# Patient Record
Sex: Female | Born: 1935 | Race: White | Hispanic: No | Smoking: Never smoker
Health system: Southern US, Community
[De-identification: ages and names within clinical notes are randomized; demographics above are authoritative.]

## PROBLEM LIST (undated history)

## (undated) DIAGNOSIS — T7840XA Allergy, unspecified, initial encounter: Secondary | ICD-10-CM

## (undated) DIAGNOSIS — N390 Urinary tract infection, site not specified: Secondary | ICD-10-CM

## (undated) DIAGNOSIS — I10 Essential (primary) hypertension: Secondary | ICD-10-CM

## (undated) DIAGNOSIS — K589 Irritable bowel syndrome without diarrhea: Secondary | ICD-10-CM

## (undated) HISTORY — PX: TONSILLECTOMY: SUR1361

## (undated) HISTORY — PX: APPENDECTOMY: SHX54

## (undated) HISTORY — PX: ABDOMINAL HYSTERECTOMY: SHX81

## (undated) HISTORY — PX: HIP ARTHROPLASTY: SHX981

## (undated) HISTORY — PX: BACK SURGERY: SHX140

---

## 2011-02-28 DIAGNOSIS — R350 Frequency of micturition: Secondary | ICD-10-CM | POA: Insufficient documentation

## 2011-02-28 DIAGNOSIS — M26629 Arthralgia of temporomandibular joint, unspecified side: Secondary | ICD-10-CM | POA: Insufficient documentation

## 2011-05-19 DIAGNOSIS — R5383 Other fatigue: Secondary | ICD-10-CM | POA: Insufficient documentation

## 2011-07-04 DIAGNOSIS — N390 Urinary tract infection, site not specified: Secondary | ICD-10-CM | POA: Insufficient documentation

## 2011-07-04 DIAGNOSIS — N398 Other specified disorders of urinary system: Secondary | ICD-10-CM | POA: Insufficient documentation

## 2011-07-26 DIAGNOSIS — H1013 Acute atopic conjunctivitis, bilateral: Secondary | ICD-10-CM | POA: Insufficient documentation

## 2011-08-03 DIAGNOSIS — R159 Full incontinence of feces: Secondary | ICD-10-CM | POA: Insufficient documentation

## 2011-11-15 DIAGNOSIS — M7501 Adhesive capsulitis of right shoulder: Secondary | ICD-10-CM | POA: Insufficient documentation

## 2011-11-15 DIAGNOSIS — M7550 Bursitis of unspecified shoulder: Secondary | ICD-10-CM | POA: Insufficient documentation

## 2012-03-13 DIAGNOSIS — H8110 Benign paroxysmal vertigo, unspecified ear: Secondary | ICD-10-CM | POA: Insufficient documentation

## 2012-03-13 DIAGNOSIS — H9313 Tinnitus, bilateral: Secondary | ICD-10-CM | POA: Insufficient documentation

## 2012-03-13 DIAGNOSIS — H903 Sensorineural hearing loss, bilateral: Secondary | ICD-10-CM | POA: Insufficient documentation

## 2012-03-29 DIAGNOSIS — K219 Gastro-esophageal reflux disease without esophagitis: Secondary | ICD-10-CM | POA: Insufficient documentation

## 2013-01-04 DIAGNOSIS — Z961 Presence of intraocular lens: Secondary | ICD-10-CM | POA: Insufficient documentation

## 2013-11-13 DIAGNOSIS — R339 Retention of urine, unspecified: Secondary | ICD-10-CM | POA: Insufficient documentation

## 2013-11-13 DIAGNOSIS — IMO0002 Reserved for concepts with insufficient information to code with codable children: Secondary | ICD-10-CM | POA: Insufficient documentation

## 2013-11-13 DIAGNOSIS — D179 Benign lipomatous neoplasm, unspecified: Secondary | ICD-10-CM | POA: Insufficient documentation

## 2014-06-03 DIAGNOSIS — F331 Major depressive disorder, recurrent, moderate: Secondary | ICD-10-CM | POA: Insufficient documentation

## 2014-12-16 DIAGNOSIS — E042 Nontoxic multinodular goiter: Secondary | ICD-10-CM | POA: Insufficient documentation

## 2014-12-27 DIAGNOSIS — Z889 Allergy status to unspecified drugs, medicaments and biological substances status: Secondary | ICD-10-CM | POA: Insufficient documentation

## 2016-06-21 DIAGNOSIS — E559 Vitamin D deficiency, unspecified: Secondary | ICD-10-CM | POA: Insufficient documentation

## 2016-06-21 DIAGNOSIS — E538 Deficiency of other specified B group vitamins: Secondary | ICD-10-CM | POA: Insufficient documentation

## 2017-05-30 DIAGNOSIS — S52125D Nondisplaced fracture of head of left radius, subsequent encounter for closed fracture with routine healing: Secondary | ICD-10-CM | POA: Insufficient documentation

## 2017-06-19 DIAGNOSIS — S96912A Strain of unspecified muscle and tendon at ankle and foot level, left foot, initial encounter: Secondary | ICD-10-CM | POA: Insufficient documentation

## 2017-10-14 DIAGNOSIS — M76892 Other specified enthesopathies of left lower limb, excluding foot: Secondary | ICD-10-CM | POA: Insufficient documentation

## 2018-06-18 DIAGNOSIS — A0471 Enterocolitis due to Clostridium difficile, recurrent: Secondary | ICD-10-CM | POA: Insufficient documentation

## 2018-09-04 DIAGNOSIS — N10 Acute tubulo-interstitial nephritis: Secondary | ICD-10-CM | POA: Insufficient documentation

## 2018-10-12 ENCOUNTER — Other Ambulatory Visit
Admission: RE | Admit: 2018-10-12 | Discharge: 2018-10-12 | Disposition: A | Discharge status: Home / Self Care | Payer: Medicare Other | Source: Ambulatory Visit | Attending: Student | Admitting: Student

## 2018-10-12 DIAGNOSIS — K529 Noninfective gastroenteritis and colitis, unspecified: Secondary | ICD-10-CM | POA: Diagnosis present

## 2018-10-12 LAB — GASTROINTESTINAL PANEL BY PCR, STOOL (REPLACES STOOL CULTURE)
Adenovirus F40/41: NOT DETECTED
Astrovirus: NOT DETECTED
Campylobacter species: NOT DETECTED
Cryptosporidium: NOT DETECTED
Cyclospora cayetanensis: NOT DETECTED
Entamoeba histolytica: NOT DETECTED
Enteroaggregative E coli (EAEC): NOT DETECTED
Enteropathogenic E coli (EPEC): NOT DETECTED
Enterotoxigenic E coli (ETEC): NOT DETECTED
Giardia lamblia: NOT DETECTED
Norovirus GI/GII: NOT DETECTED
PLESIMONAS SHIGELLOIDES: NOT DETECTED
Rotavirus A: NOT DETECTED
SALMONELLA SPECIES: NOT DETECTED
SHIGA LIKE TOXIN PRODUCING E COLI (STEC): NOT DETECTED
SHIGELLA/ENTEROINVASIVE E COLI (EIEC): NOT DETECTED
Sapovirus (I, II, IV, and V): NOT DETECTED
Vibrio cholerae: NOT DETECTED
Vibrio species: NOT DETECTED
Yersinia enterocolitica: NOT DETECTED

## 2018-10-12 LAB — C DIFFICILE QUICK SCREEN W PCR REFLEX
C Diff antigen: NEGATIVE
C Diff interpretation: NOT DETECTED
C Diff toxin: NEGATIVE

## 2018-10-16 LAB — CALPROTECTIN, FECAL: Calprotectin, Fecal: 32 ug/g (ref 0–120)

## 2018-10-16 LAB — PANCREATIC ELASTASE, FECAL: Pancreatic Elastase-1, Stool: 414 ug Elast./g (ref >200)

## 2018-10-24 ENCOUNTER — Encounter: Payer: Self-pay | Admitting: Urology

## 2018-10-24 ENCOUNTER — Ambulatory Visit (INDEPENDENT_AMBULATORY_CARE_PROVIDER_SITE_OTHER): Payer: Medicare Other | Admitting: Urology

## 2018-10-24 VITALS — BP 117/58 | HR 78 | Ht 62.0 in | Wt 151.0 lb

## 2018-10-24 DIAGNOSIS — N390 Urinary tract infection, site not specified: Secondary | ICD-10-CM | POA: Diagnosis not present

## 2018-10-24 DIAGNOSIS — N3941 Urge incontinence: Secondary | ICD-10-CM | POA: Diagnosis not present

## 2018-10-24 LAB — URINALYSIS, COMPLETE
Bilirubin, UA: NEGATIVE
Glucose, UA: NEGATIVE
Ketones, UA: NEGATIVE
Nitrite, UA: NEGATIVE
Specific Gravity, UA: 1.015 (ref 1.005–1.030)
Urobilinogen, Ur: 0.2 mg/dL (ref 0.2–1.0)
pH, UA: 5.5 (ref 5.0–7.5)

## 2018-10-24 LAB — MICROSCOPIC EXAMINATION: RBC MICROSCOPIC, UA: NONE SEEN /HPF (ref 0–2)

## 2018-10-24 NOTE — Progress Notes (Signed)
10/24/2018 9:15 AM   Molly Figueroa [birth year] [id number]  Referring provider: Donnamarie Rossetti, PA-C [place], [place] [address]  Chief Complaint  Patient presents with  . Recurrent UTI    HPI:  85 yo female referred for recurrent UTI. She is on vaginal estrogen since Nov 2019. She's been having "UTI" for years. She gets dysuria and abx help. She typically voids with a good stream and no straining, but doesn't empty. She has seen the downside to abx with recent diarrhea and C. Diff. She saw [facility] for years. In the past her post void residuals have been around 200 mL. In order to help empty, they tried clean intermittent catheterization (patient thought this increased her infections due to difficulty catheterizing her self cleanly), flomax (did not tolerate side effects), tibial nerve stimulation (no improvement in emptying). Prior cystoscopies (2012, 2014, 2016) and CT workups (2019) have been unrevealing. CT A/P 04/2018 at [facility] for possible GI bleed report mentions no hydronephrosis or stones. 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 No gross hematuria. She drinks mainly water. She has IBS, diarrhea. She also has some urgency and UUI. She is interested in PT/OT at [facility].   NG risk includes CVA. Prior hysterectomy. Mobility issues/waler use.   Modifying factors: There are no other modifying factors  Associated signs and symptoms: There are no other associated signs and symptoms Aggravating and relieving factors: There are no other aggravating or relieving factors Severity: Moderate Duration: Persistent   PMH: History reviewed. No pertinent past medical history.  Surgical History: History reviewed. No pertinent surgical history.  Home Medications:  Allergies as of 10/24/2018      Reactions   Amlodipine Swelling   Codeine Nausea And Vomiting   Gabapentin Itching   Gentak [gentamicin Sulfate] Itching   Phenothiazines Nausea  And Vomiting   Tobradex [tobramycin-dexamethasone] Itching   Wellbutrin [bupropion] Swelling   Throat pain   Cefuroxime Axetil Rash   Macrolides And Ketolides Rash   Sulfa Antibiotics Rash      Medication List       Accurate as of October 24, 2018  9:15 AM. Always use your most recent med list.        aspirin EC 81 MG tablet Take by mouth.   belladonna-opium 16.2-30 MG suppository Commonly known as:  B&O SUPPRETTES UNW AND I 1 SUP REC Q 8 H PRN   dicyclomine 20 MG tablet Commonly known as:  BENTYL Take by mouth.   escitalopram 20 MG tablet Commonly known as:  LEXAPRO Take by mouth.   estradiol 0.1 MG/GM vaginal cream Commonly known as:  ESTRACE Place vaginally.   lidocaine 5 % Commonly known as:  [place] onto the skin.   meloxicam 7.5 MG tablet Commonly known as:  MOBIC Take by mouth.   Olopatadine HCl 0.2 % Soln Apply to eye.   omeprazole 20 MG capsule Commonly known as:  PRILOSEC Take by mouth.   telmisartan 20 MG tablet Commonly known as:  MICARDIS Take by mouth.   triamterene-hydrochlorothiazide 37.5-25 MG tablet Commonly known as:  MAXZIDE-25 Take by mouth.   vancomycin 125 MG capsule Commonly known as:  VANCOCIN Take by mouth.       Allergies:  Allergies  Allergen Reactions  . Amlodipine Swelling  . Codeine Nausea And Vomiting  . Gabapentin Itching  . Gentak [Gentamicin Sulfate] Itching  . Phenothiazines Nausea And Vomiting  . Tobradex [Tobramycin-Dexamethasone] Itching  .  Wellbutrin [Bupropion] Swelling    Throat pain   . Cefuroxime Axetil Rash  . Macrolides And Ketolides Rash  . Sulfa Antibiotics Rash    Family History: Family History  Problem Relation Age of Onset  . Bladder Cancer Neg Hx   . Kidney cancer Neg Hx     Social History:  reports that she has never smoked. She has never used smokeless tobacco. She reports previous alcohol use. She reports that she does not use drugs.  ROS: UROLOGY Frequent  Urination?: Yes Hard to postpone urination?: No Burning/pain with urination?: No Get up at night to urinate?: Yes Leakage of urine?: Yes Urine stream starts and stops?: No Trouble starting stream?: No Do you have to strain to urinate?: No Blood in urine?: No Urinary tract infection?: Yes Sexually transmitted disease?: No Injury to kidneys or bladder?: No Painful intercourse?: No Weak stream?: No Currently pregnant?: No Vaginal bleeding?: No Last menstrual period?: n  Gastrointestinal Nausea?: No Vomiting?: No Indigestion/heartburn?: Yes Diarrhea?: Yes Constipation?: No  Constitutional Fever: No Night sweats?: No Weight loss?: No Fatigue?: Yes  Skin Skin rash/lesions?: Yes Itching?: Yes  Eyes Blurred vision?: Yes Double vision?: No  Ears/Nose/Throat Sore throat?: No Sinus problems?: No  Hematologic/Lymphatic Swollen glands?: No Easy bruising?: No  Cardiovascular Leg swelling?: No Chest pain?: No  Respiratory Cough?: No Shortness of breath?: No  Endocrine Excessive thirst?: No  Musculoskeletal Back pain?: No Joint pain?: No  Neurological Headaches?: No Dizziness?: No  Psychologic Depression?: No Anxiety?: No  Physical Exam: BP (!) 117/58 (BP Location: Left Arm, Patient Position: Sitting, Cuff Size: Normal)   Pulse 78   Ht 5\' 2"  (1.575 m)   Wt 68.5 kg   BMI 27.62 kg/m   Constitutional:  Alert and oriented, No acute distress. HEENT: [place] AT, moist mucus membranes.  Trachea midline, no masses. Cardiovascular: No clubbing, cyanosis, or edema. Respiratory: Normal respiratory effort, no increased work of breathing. GI: Abdomen is soft, nontender, nondistended, no abdominal masses GU: No CVA tenderness Lymph: No cervical or inguinal lymphadenopathy. Skin: No rashes, bruises or suspicious lesions. Neurologic: Grossly intact, no focal deficits, moving all 4 extremities. Psychiatric: Normal mood and affect.  Laboratory Data: No results found  for: WBC, HGB, HCT, MCV, PLT  No results found for: CREATININE  No results found for: PSA  No results found for: TESTOSTERONE  No results found for: HGBA1C  Urinalysis No results found for: COLORURINE, APPEARANCEUR, LABSPEC, PHURINE, GLUCOSEU, HGBUR, BILIRUBINUR, KETONESUR, PROTEINUR, UROBILINOGEN, NITRITE, LEUKOCYTESUR  No results found for: LABMICR, WBCUA, RBCUA, LABEPIT, MUCUS, BACTERIA   No results found for this or any previous visit. No results found for this or any previous visit. No results found for this or any previous visit. No results found for this or any previous visit. No results found for this or any previous visit. No results found for this or any previous visit. No results found for this or any previous visit. No results found for this or any previous visit.  Assessment & Plan:    1. Recurrent UTI She is doing well on TV estrogen and we will definitely avoid abx unless significant bladder pain, dysuria or fever. Discussed bacteria in bladder can actually protect against UTI.   2. Urgency, UUI - pt has PT/OT available at [facility] and she wants to start.   - Urinalysis, Complete   No follow-ups on file.  Festus Aloe, MD  [facility] [address] [place], [place] [address] [phone]

## 2018-10-24 NOTE — Patient Instructions (Signed)

## 2018-10-29 ENCOUNTER — Telehealth: Payer: Self-pay | Admitting: Urology

## 2018-10-29 NOTE — Telephone Encounter (Signed)
Left message for the PT dept at [facility] to call back to discuss if the patient can have PT there? [place]

## 2018-10-30 NOTE — Telephone Encounter (Signed)
Mckinley from [facility] called back and stated that she was taking care of getting the PT arranged for the patient and did not need an order.   Sharyn Lull

## 2018-11-12 ENCOUNTER — Other Ambulatory Visit: Payer: Self-pay | Admitting: Sports Medicine

## 2018-11-12 DIAGNOSIS — M1611 Unilateral primary osteoarthritis, right hip: Secondary | ICD-10-CM

## 2018-11-12 DIAGNOSIS — M25451 Effusion, right hip: Secondary | ICD-10-CM

## 2018-11-12 DIAGNOSIS — W19XXXD Unspecified fall, subsequent encounter: Secondary | ICD-10-CM

## 2018-11-12 DIAGNOSIS — M25551 Pain in right hip: Secondary | ICD-10-CM

## 2018-11-13 ENCOUNTER — Ambulatory Visit
Admission: RE | Admit: 2018-11-13 | Discharge: 2018-11-13 | Disposition: A | Discharge status: Home / Self Care | Payer: Medicare Other | Source: Ambulatory Visit | Attending: Sports Medicine | Admitting: Sports Medicine

## 2018-11-13 ENCOUNTER — Ambulatory Visit: Payer: Medicare Other

## 2018-11-13 ENCOUNTER — Other Ambulatory Visit: Payer: Self-pay

## 2018-11-13 DIAGNOSIS — G8929 Other chronic pain: Secondary | ICD-10-CM | POA: Diagnosis not present

## 2018-11-13 DIAGNOSIS — M1611 Unilateral primary osteoarthritis, right hip: Secondary | ICD-10-CM | POA: Diagnosis present

## 2018-11-13 DIAGNOSIS — W19XXXD Unspecified fall, subsequent encounter: Secondary | ICD-10-CM | POA: Insufficient documentation

## 2018-11-13 DIAGNOSIS — M25551 Pain in right hip: Secondary | ICD-10-CM

## 2018-11-13 DIAGNOSIS — R55 Syncope and collapse: Secondary | ICD-10-CM | POA: Diagnosis not present

## 2018-11-13 DIAGNOSIS — M25451 Effusion, right hip: Secondary | ICD-10-CM | POA: Diagnosis present

## 2019-01-02 DIAGNOSIS — M8700 Idiopathic aseptic necrosis of unspecified bone: Secondary | ICD-10-CM | POA: Insufficient documentation

## 2019-01-22 HISTORY — PX: JOINT REPLACEMENT: SHX530

## 2019-01-27 ENCOUNTER — Other Ambulatory Visit: Payer: Self-pay

## 2019-01-27 ENCOUNTER — Emergency Department
Admission: EM | Admit: 2019-01-27 | Discharge: 2019-01-27 | Disposition: A | Discharge status: Home / Self Care | Payer: Medicare Other | Attending: Emergency Medicine | Admitting: Emergency Medicine

## 2019-01-27 ENCOUNTER — Emergency Department: Payer: Medicare Other

## 2019-01-27 DIAGNOSIS — I10 Essential (primary) hypertension: Secondary | ICD-10-CM | POA: Insufficient documentation

## 2019-01-27 DIAGNOSIS — R2241 Localized swelling, mass and lump, right lower limb: Secondary | ICD-10-CM | POA: Diagnosis not present

## 2019-01-27 DIAGNOSIS — R609 Edema, unspecified: Secondary | ICD-10-CM | POA: Diagnosis not present

## 2019-01-27 DIAGNOSIS — Z79899 Other long term (current) drug therapy: Secondary | ICD-10-CM | POA: Diagnosis not present

## 2019-01-27 HISTORY — DX: Essential (primary) hypertension: I10

## 2019-01-27 LAB — BASIC METABOLIC PANEL
Anion gap: 7 (ref 5–15)
BUN: 17 mg/dL (ref 8–23)
CO2: 22 mmol/L (ref 22–32)
Calcium: 8.7 mg/dL — ABNORMAL LOW (ref 8.9–10.3)
Chloride: 110 mmol/L (ref 98–111)
Creatinine, Ser: 1.65 mg/dL — ABNORMAL HIGH (ref 0.44–1.00)
GFR calc Af Amer: 32 mL/min — ABNORMAL LOW (ref >60)
GFR calc non Af Amer: 28 mL/min — ABNORMAL LOW (ref >60)
Glucose, Bld: 111 mg/dL — ABNORMAL HIGH (ref 70–99)
Potassium: 3.8 mmol/L (ref 3.5–5.1)
Sodium: 139 mmol/L (ref 135–145)

## 2019-01-27 LAB — CBC WITH DIFFERENTIAL/PLATELET
Abs Immature Granulocytes: 0.06 10*3/uL (ref 0.00–0.07)
Basophils Absolute: 0.1 10*3/uL (ref 0.0–0.1)
Basophils Relative: 1 %
Eosinophils Absolute: 0.3 10*3/uL (ref 0.0–0.5)
Eosinophils Relative: 4 %
HCT: 30.8 % — ABNORMAL LOW (ref 36.0–46.0)
Hemoglobin: 10 g/dL — ABNORMAL LOW (ref 12.0–15.0)
Immature Granulocytes: 1 %
Lymphocytes Relative: 14 %
Lymphs Abs: 1.1 10*3/uL (ref 0.7–4.0)
MCH: 29.4 pg (ref 26.0–34.0)
MCHC: 32.5 g/dL (ref 30.0–36.0)
MCV: 90.6 fL (ref 80.0–100.0)
Monocytes Absolute: 0.6 10*3/uL (ref 0.1–1.0)
Monocytes Relative: 8 %
Neutro Abs: 5.8 10*3/uL (ref 1.7–7.7)
Neutrophils Relative %: 72 %
Platelets: 312 10*3/uL (ref 150–400)
RBC: 3.4 MIL/uL — ABNORMAL LOW (ref 3.87–5.11)
RDW: 14.3 % (ref 11.5–15.5)
WBC: 8 10*3/uL (ref 4.0–10.5)
nRBC: 0 % (ref 0.0–0.2)

## 2019-01-27 NOTE — ED Notes (Signed)
Pt had a hip replacement 5/26 and now her right leg is swollen and painful- right leg is larger than left and skin is shiny

## 2019-01-27 NOTE — ED Notes (Signed)
Pt gave verbal consent to to speak with her sister Elaine [phone]- sister was given an update and requested to be called with update

## 2019-01-27 NOTE — ED Triage Notes (Signed)
Pt presents via POV c/o unilateral right leg swelling. Pt is s/p right hip surgery, d/c from hospital yesterday. Reports leg is sore. Right leg noticeably larger than left.

## 2019-01-27 NOTE — ED Notes (Signed)
EDP at bedside  

## 2019-01-27 NOTE — ED Provider Notes (Addendum)
[facility] Emergency Department Provider Note  ____________________________________________   I have reviewed the triage vital signs and the nursing notes. Where available I have reviewed prior notes and, if possible and indicated, outside hospital notes.    HISTORY  Chief Complaint Leg Swelling    HPI Molly Figueroa is a 85 y.o. female patient seen and evaluated during the coronavirus epidemic during a time with low staffing who states she had surgery on 6 May at an outside facility, hip replacement on the right hip.  No significant complications.  Was in the hospital for a brief period of time because she had a UTI which is now resolved.  She states that she went home yesterday, she stopped wearing compression stockings, started walking around a lot more, and now notices that right leg is more swollen.  Has been more swollen really since she was discharged.  No fever no chills no nausea no vomiting no abdominal pain, she states she is ambulating well.  There is no recent lab values for this patient do not know her baseline creatinine.  In any event, she has no pain she is ambulating well but she is worried about the swelling.  Is on the right side where she had her surgery.   No chest pain or shortness of breath no other complaints.  Past Medical History:  Diagnosis Date  . Hypertension     There are no active problems to display for this patient.   Past Surgical History:  Procedure Laterality Date  . HIP ARTHROPLASTY      Prior to Admission medications   Medication Sig Start Date End Date Taking? Authorizing Provider  aspirin EC 81 MG tablet Take by mouth.    [provider]  belladonna-opium (B&O SUPPRETTES) 16.2-30 MG suppository UNW AND I 1 SUP REC Q 8 H PRN 05/22/18   [provider]  dicyclomine (BENTYL) 20 MG tablet Take by mouth. 07/17/18   [provider]  escitalopram (LEXAPRO) 20 MG tablet Take by mouth. 08/31/18    [provider]  estradiol (ESTRACE) 0.1 MG/GM vaginal cream Place vaginally. 07/05/18 07/05/19  [provider]  lidocaine (LIDODERM) 5 % Place onto the skin. 08/31/18   [provider]  meloxicam (MOBIC) 7.5 MG tablet Take by mouth. 07/17/18 07/17/19  [provider]  Olopatadine HCl 0.2 % SOLN Apply to eye. 09/11/15   [provider]  omeprazole (PRILOSEC) 20 MG capsule Take by mouth. 08/31/18 08/31/19  [provider]  telmisartan (MICARDIS) 20 MG tablet Take by mouth. 07/17/18   [provider]  triamterene-hydrochlorothiazide (MAXZIDE-25) 37.5-25 MG tablet Take by mouth. 08/28/18   [provider]  vancomycin (VANCOCIN) 125 MG capsule Take by mouth.    [provider]    Allergies Amlodipine; Codeine; Gabapentin; Gentak [gentamicin sulfate]; Phenothiazines; Tobradex [tobramycin-dexamethasone]; Wellbutrin [bupropion]; Cefuroxime axetil; Macrolides and ketolides; and Sulfa antibiotics  Family History  Problem Relation Age of Onset  . Bladder Cancer Neg Hx   . Kidney cancer Neg Hx     Social History Social History   Tobacco Use  . Smoking status: Never Smoker  . Smokeless tobacco: Never Used  Substance Use Topics  . Alcohol use: Not Currently  . Drug use: Never    Review of Systems Constitutional: No fever/chills Eyes: No visual changes. ENT: No sore throat. No stiff neck no neck pain Cardiovascular: Denies chest pain. Respiratory: Denies shortness of breath. Gastrointestinal:   no vomiting.  No diarrhea.  No constipation. Genitourinary: Negative for dysuria. Musculoskeletal: + lower extremity swelling Skin: Negative for rash. Neurological: Negative for severe headaches, focal weakness or numbness.   ____________________________________________   PHYSICAL EXAM:  VITAL SIGNS: ED Triage Vitals  Enc Vitals Group     BP 01/27/19 1141 (!) 141/63     Pulse Rate 01/27/19 1141 72     Resp 01/27/19  1141 14     Temp 01/27/19 1141 98.6 F (37 C)     Temp Source 01/27/19 1141 Oral     SpO2 01/27/19 1141 98 %     Weight --      Height --      Head Circumference --      Peak Flow --      Pain Score 01/27/19 1138 8     Pain Loc --      Pain Edu? --      Excl. in [place]? --     Constitutional: Alert and oriented. Well appearing and in no acute distress. Eyes: Conjunctivae are normal Head: Atraumatic HEENT: No congestion/rhinnorhea. Mucous membranes are moist.  Oropharynx non-erythematous Neck:   Nontender with no meningismus, no masses, no stridor Cardiovascular: Normal rate, regular rhythm. Grossly normal heart sounds.  Good peripheral circulation. Respiratory: Normal respiratory effort.  No retractions. Lungs CTAB. Abdominal: Soft and nontender. No distention. No guarding no rebound Back:  There is no focal tenderness or step off.  there is no midline tenderness there are no lesions noted. there is no CVA tenderness Musculoskeletal: He of incision is clean dry intact does not appear to be infected slight tenderness noted but no erythema not fluctuant not hot to touch not red.  Her right leg is slightly greater than her left leg diffusely but there is no Homans sign no calf pain or tenderness.  Strong distal pulses.  No upper extremity tenderness. No joint effusions, no DVT signs strong distal pulses no minimal edema on the left as well Neurologic:  Normal speech and language. No gross focal neurologic deficits are appreciated.  Skin:  Skin is warm, dry and intact. No rash noted. Psychiatric: Mood and affect are normal. Speech and behavior are normal.  ____________________________________________   LABS (all labs ordered are listed, but only abnormal results are displayed)  Labs Reviewed  CBC WITH DIFFERENTIAL/PLATELET - Abnormal; Notable for the following components:      Result Value   RBC 3.40 (*)    Hemoglobin 10.0 (*)    HCT 30.8 (*)    All other components within normal  limits  BASIC METABOLIC PANEL    Pertinent labs  results that were available during my care of the patient were reviewed by me and considered in my medical decision making (see chart for details). ____________________________________________  EKG  I personally interpreted any EKGs ordered by me or triage  ____________________________________________  RADIOLOGY  Pertinent labs & imaging results that were available during my care of the patient were reviewed by me and considered in my medical decision making (see chart for details). If possible, patient and/or family made aware of any abnormal findings.  US Venous Img Lower Unilateral Right  Result Date: 01/27/2019 CLINICAL DATA:  Swelling EXAM: RIGHT LOWER EXTREMITY VENOUS DUPLEX ULTRASOUND TECHNIQUE: Doppler venous assessment of the right lower extremity deep venous system was performed, including characterization of spectral flow, compressibility, and phasicity. COMPARISON:  None. FINDINGS: There is complete compressibility of the right common femoral, femoral, and popliteal veins. Doppler analysis demonstrates respiratory phasicity and augmentation of  flow with calf compression. No obvious superficial vein or calf vein thrombosis. IMPRESSION: No evidence of right lower extremity DVT. Electronically Signed   By: Marybelle Killings M.D.   On: 01/27/2019 13:50   ____________________________________________    PROCEDURES  Procedure(s) performed: None  Procedures  Critical Care performed: None  ____________________________________________   INITIAL IMPRESSION / ASSESSMENT AND PLAN / ED COURSE  Pertinent labs & imaging results that were available during my care of the patient were reviewed by me and considered in my medical decision making (see chart for details).  Patient here after surgery, she is now up and walking around took off her compression stockings and has some swelling on the right side.  Fortunately, she does not have  evidence of a DVT.  Creatinine is somewhat elevated, but we do not know her baseline I cannot find it in care everywhere.  There is been no creatinine checked this year that I can find.  Patient no acute distress.  I will have her follow close with primary care and her surgeons tomorrow and then report her creatinine to them.  Have elected to give her IV fluid in the context of lower extremity edema although I do feel that this edema, which is primarily unilateral, is secondary to her postop swelling which is now exacerbated by increased ambulation and decreased compression stocking wearing.  I advised her to wear compression stockings at all times.  She does not appear to have any evidence of CHF, her lungs are clear, no chest pain, no exertional symptoms.  Evidence of a wound infection.  She is quite well-appearing and we will try to discharge her before she catches the coronavirus.  ----------------------------------------- 4:41 PM on 01/27/2019 -----------------------------------------  Discussed with her daughter, and she is now made aware of the creatinine 1.6, they were followed closely by her PCP, BUN is 17 fortunately, and again we do not know her baseline.  Return precautions and follow-up given and understood.   ____________________________________________   FINAL CLINICAL IMPRESSION(S) / ED DIAGNOSES  Final diagnoses:  None      This chart was dictated using voice recognition software.  Despite best efforts to proofread,  errors can occur which can change meaning.      Schuyler Amor, MD 01/27/19 1636    Schuyler Amor, MD 01/27/19 [phone]

## 2019-01-27 NOTE — Discharge Instructions (Addendum)
Keep that leg elevated when you are not walking, use compression stockings especially on the right side as it will help the swelling go down, if you have chest pain shortness of breath redness or swelling fever or you feel worse in any way return to the emergency department.  Noticed that your creatinine is 1.65 which is a measure of your kidney function, we do not know if that is off from your normal, we would ask you to call your primary care doctor first thing tomorrow morning and talk to them about that.

## 2019-02-01 ENCOUNTER — Encounter: Payer: Self-pay | Admitting: Urology

## 2019-02-01 ENCOUNTER — Ambulatory Visit (INDEPENDENT_AMBULATORY_CARE_PROVIDER_SITE_OTHER): Payer: Medicare Other | Admitting: Urology

## 2019-02-01 ENCOUNTER — Other Ambulatory Visit: Payer: Self-pay

## 2019-02-01 VITALS — BP 138/76 | HR 80 | Ht 62.0 in | Wt 156.0 lb

## 2019-02-01 DIAGNOSIS — R350 Frequency of micturition: Secondary | ICD-10-CM

## 2019-02-01 DIAGNOSIS — N390 Urinary tract infection, site not specified: Secondary | ICD-10-CM | POA: Diagnosis not present

## 2019-02-01 LAB — URINALYSIS, COMPLETE
Bilirubin, UA: NEGATIVE
Glucose, UA: NEGATIVE
Ketones, UA: NEGATIVE
Nitrite, UA: NEGATIVE
Protein,UA: NEGATIVE
Specific Gravity, UA: 1.015 (ref 1.005–1.030)
Urobilinogen, Ur: 0.2 mg/dL (ref 0.2–1.0)
pH, UA: 5.5 (ref 5.0–7.5)

## 2019-02-01 LAB — MICROSCOPIC EXAMINATION
Bacteria, UA: NONE SEEN
Epithelial Cells (non renal): NONE SEEN /hpf (ref 0–10)

## 2019-02-01 MED ORDER — SOLIFENACIN SUCCINATE 5 MG PO TABS: 5.0000 mg | ORAL_TABLET | Freq: Every day | ORAL | 3 refills | Status: DC

## 2019-02-01 NOTE — Progress Notes (Signed)
02/01/2019 1:52 PM   Molly Figueroa April 04, 1931 [id number]  Referring provider: Donnamarie Rossetti, PA-C [place], [place] [address]  Chief Complaint  Patient presents with  . Urinary Incontinence    HPI:  85 yo female referred for recurrent UTI. She is on vaginal estrogen since Nov 2019. She's been having "UTI" for years. She gets dysuria and abx help. She typically voids with a good stream and no straining, but doesn't empty. She has seen the downside to abx with recent diarrhea and C. Diff. She saw [facility] for years. In the past her post void residuals have been around 200 mL. In order to help empty, they tried clean intermittent catheterization (patient thought this increased her infections due to difficulty catheterizing her self cleanly), flomax (did not tolerate side effects), tibial nerve stimulation (no improvement in emptying). Prior cystoscopies (2012, 2014, 2016) and CT workups (2019) have been unrevealing. CT A/P 04/2018 at [facility] for possible GI bleed report mentions no hydronephrosis or stones. 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 No gross hematuria. She drinks mainly water. She has IBS, diarrhea. She also has some urgency and UUI. She started PT/OT at [facility] Feb 2020.   NG risk includes CVA. Prior hysterectomy. Mobility issues/waler use.   Today, she is doing well. She has been doing PT. She c/o frequency and urgency -- day and night, but recently had a right hip replacement. Her Cr rose to 1.65 , gfr 28, but Cr was 1.2 on 01/31/2019, gfr 43. On CE.  No gross hematuria.   PMH: Past Medical History:  Diagnosis Date  . Hypertension     Surgical History: Past Surgical History:  Procedure Laterality Date  . HIP ARTHROPLASTY      Home Medications:  Allergies as of 02/01/2019      Reactions   Amlodipine Swelling   Codeine Nausea And Vomiting   Gabapentin Itching   Gentak [gentamicin Sulfate] Itching   Phenothiazines  Nausea And Vomiting   Tobradex [tobramycin-dexamethasone] Itching   Wellbutrin [bupropion] Swelling   Throat pain   Cefuroxime Axetil Rash   Macrolides And Ketolides Rash   Sulfa Antibiotics Rash      Medication List       Accurate as of February 01, 2019  1:52 PM. If you have any questions, ask your nurse or doctor.        aspirin EC 81 MG tablet Take by mouth.   belladonna-opium 16.2-30 MG suppository Commonly known as:  B&O SUPPRETTES UNW AND I 1 SUP REC Q 8 H PRN   dicyclomine 20 MG tablet Commonly known as:  BENTYL Take by mouth.   escitalopram 20 MG tablet Commonly known as:  LEXAPRO Take by mouth.   estradiol 0.1 MG/GM vaginal cream Commonly known as:  ESTRACE Place vaginally.   lidocaine 5 % Commonly known as:  [place] onto the skin.   meloxicam 7.5 MG tablet Commonly known as:  MOBIC Take by mouth.   Olopatadine HCl 0.2 % Soln Apply to eye.   omeprazole 20 MG capsule Commonly known as:  PRILOSEC Take by mouth.   telmisartan 20 MG tablet Commonly known as:  MICARDIS Take by mouth.   triamterene-hydrochlorothiazide 37.5-25 MG tablet Commonly known as:  MAXZIDE-25 Take by mouth.   vancomycin 125 MG capsule Commonly known as:  VANCOCIN Take by mouth.       Allergies:  Allergies  Allergen Reactions  . Amlodipine Swelling  .  Codeine Nausea And Vomiting  . Gabapentin Itching  . Gentak [Gentamicin Sulfate] Itching  . Phenothiazines Nausea And Vomiting  . Tobradex [Tobramycin-Dexamethasone] Itching  . Wellbutrin [Bupropion] Swelling    Throat pain   . Cefuroxime Axetil Rash  . Macrolides And Ketolides Rash  . Sulfa Antibiotics Rash    Family History: Family History  Problem Relation Age of Onset  . Bladder Cancer Neg Hx   . Kidney cancer Neg Hx     Social History:  reports that she has never smoked. She has never used smokeless tobacco. She reports previous alcohol use. She reports that she does not use drugs.  ROS:                                         Physical Exam: There were no vitals taken for this visit.  Constitutional:  Alert and oriented, No acute distress. She is in a wheelchair.  HEENT: [place] AT, moist mucus membranes.  Trachea midline, no masses. Cardiovascular: No clubbing, cyanosis, or edema. Respiratory: Normal respiratory effort, no increased work of breathing. GI: Abdomen is soft, nontender, nondistended, no abdominal masses Skin: No rashes, bruises or suspicious lesions. Neurologic: Grossly intact, no focal deficits, moving all 4 extremities. Psychiatric: Normal mood and affect.  Laboratory Data: Lab Results  Component Value Date   WBC 8.0 01/27/2019   HGB 10.0 (L) 01/27/2019   HCT 30.8 (L) 01/27/2019   MCV 90.6 01/27/2019   PLT 312 01/27/2019    Lab Results  Component Value Date   CREATININE 1.65 (H) 01/27/2019    No results found for: PSA  No results found for: TESTOSTERONE  No results found for: HGBA1C  Urinalysis    Component Value Date/Time   APPEARANCEUR Cloudy (A) 10/24/2018 0904   GLUCOSEU Negative 10/24/2018 0904   BILIRUBINUR Negative 10/24/2018 0904   PROTEINUR 1+ (A) 10/24/2018 0904   NITRITE Negative 10/24/2018 0904   LEUKOCYTESUR 3+ (A) 10/24/2018 0904    Lab Results  Component Value Date   LABMICR See below: 10/24/2018   WBCUA >30W 10/24/2018   RBCUA None seen 10/24/2018   LABEPIT 0-10 10/24/2018   BACTERIA Moderate (A) 10/24/2018    No results found for this or any previous visit. No results found for this or any previous visit. No results found for this or any previous visit. No results found for this or any previous visit. No results found for this or any previous visit. No results found for this or any previous visit. No results found for this or any previous visit. No results found for this or any previous visit.  Assessment & Plan:    1) UTI - no worrisome symptoms today  2) frequency, urgency - discussed  addition of OAB med -   No follow-ups on file.  Festus Aloe, MD  [facility] [address], [place], [place] [address] [phone]

## 2019-02-01 NOTE — Patient Instructions (Signed)

## 2019-02-05 ENCOUNTER — Telehealth: Payer: Self-pay | Admitting: *Deleted

## 2019-02-05 NOTE — Telephone Encounter (Signed)
Received PA approval for Solifenacin   Key: [id number] - PA Case ID: [id number] - Rx #: [id number] [address] Outcome Approved today [id number];SXJDBZ:[id number];Review Type:Prior Auth;Coverage Start Date:01/06/2019;Coverage End Date:08/28/2098;

## 2019-03-04 ENCOUNTER — Other Ambulatory Visit: Payer: Self-pay | Admitting: Urology

## 2019-03-04 DIAGNOSIS — Z96649 Presence of unspecified artificial hip joint: Secondary | ICD-10-CM | POA: Insufficient documentation

## 2019-03-04 MED ORDER — SOLIFENACIN SUCCINATE 5 MG PO TABS: 5.0000 mg | ORAL_TABLET | Freq: Every day | ORAL | 3 refills | Status: AC

## 2019-03-04 NOTE — Telephone Encounter (Signed)
Script sent  

## 2019-03-04 NOTE — Telephone Encounter (Signed)
Molly Figueroa is requesting a new Rx for 90-day supply of Vesicare to be sent to [facility].  Molly Figueroa saw Dr. Junious Silk on 02/05/19 and a Rx was sent to [facility] for 30 days with 3 refills.  Molly Figueroa states that the medication is working, but she would rather get the 90 day supply from [facility], if possible.

## 2019-05-08 ENCOUNTER — Other Ambulatory Visit: Payer: Self-pay

## 2019-05-08 ENCOUNTER — Encounter: Payer: Self-pay | Admitting: Urology

## 2019-05-08 ENCOUNTER — Ambulatory Visit (INDEPENDENT_AMBULATORY_CARE_PROVIDER_SITE_OTHER): Payer: Medicare Other | Admitting: Urology

## 2019-05-08 VITALS — BP 129/77 | HR 73 | Wt 154.0 lb

## 2019-05-08 DIAGNOSIS — M159 Polyosteoarthritis, unspecified: Secondary | ICD-10-CM | POA: Insufficient documentation

## 2019-05-08 DIAGNOSIS — G5603 Carpal tunnel syndrome, bilateral upper limbs: Secondary | ICD-10-CM | POA: Insufficient documentation

## 2019-05-08 DIAGNOSIS — R350 Frequency of micturition: Secondary | ICD-10-CM | POA: Diagnosis not present

## 2019-05-08 DIAGNOSIS — M810 Age-related osteoporosis without current pathological fracture: Secondary | ICD-10-CM | POA: Insufficient documentation

## 2019-05-08 DIAGNOSIS — E785 Hyperlipidemia, unspecified: Secondary | ICD-10-CM | POA: Insufficient documentation

## 2019-05-08 DIAGNOSIS — M654 Radial styloid tenosynovitis [de Quervain]: Secondary | ICD-10-CM | POA: Insufficient documentation

## 2019-05-08 DIAGNOSIS — M2040 Other hammer toe(s) (acquired), unspecified foot: Secondary | ICD-10-CM | POA: Insufficient documentation

## 2019-05-08 DIAGNOSIS — S37009A Unspecified injury of unspecified kidney, initial encounter: Secondary | ICD-10-CM | POA: Insufficient documentation

## 2019-05-08 DIAGNOSIS — D369 Benign neoplasm, unspecified site: Secondary | ICD-10-CM | POA: Insufficient documentation

## 2019-05-08 DIAGNOSIS — Z9049 Acquired absence of other specified parts of digestive tract: Secondary | ICD-10-CM | POA: Insufficient documentation

## 2019-05-08 DIAGNOSIS — S52539A Colles' fracture of unspecified radius, initial encounter for closed fracture: Secondary | ICD-10-CM | POA: Insufficient documentation

## 2019-05-08 DIAGNOSIS — D649 Anemia, unspecified: Secondary | ICD-10-CM | POA: Insufficient documentation

## 2019-05-08 DIAGNOSIS — M25559 Pain in unspecified hip: Secondary | ICD-10-CM | POA: Insufficient documentation

## 2019-05-08 DIAGNOSIS — M1610 Unilateral primary osteoarthritis, unspecified hip: Secondary | ICD-10-CM | POA: Insufficient documentation

## 2019-05-08 DIAGNOSIS — M5412 Radiculopathy, cervical region: Secondary | ICD-10-CM | POA: Insufficient documentation

## 2019-05-08 DIAGNOSIS — D126 Benign neoplasm of colon, unspecified: Secondary | ICD-10-CM | POA: Insufficient documentation

## 2019-05-08 DIAGNOSIS — G459 Transient cerebral ischemic attack, unspecified: Secondary | ICD-10-CM | POA: Insufficient documentation

## 2019-05-08 DIAGNOSIS — I10 Essential (primary) hypertension: Secondary | ICD-10-CM | POA: Insufficient documentation

## 2019-05-08 DIAGNOSIS — N390 Urinary tract infection, site not specified: Secondary | ICD-10-CM | POA: Insufficient documentation

## 2019-05-08 DIAGNOSIS — E8729 Other acidosis: Secondary | ICD-10-CM | POA: Insufficient documentation

## 2019-05-08 DIAGNOSIS — M5416 Radiculopathy, lumbar region: Secondary | ICD-10-CM | POA: Insufficient documentation

## 2019-05-08 DIAGNOSIS — N362 Urethral caruncle: Secondary | ICD-10-CM | POA: Insufficient documentation

## 2019-05-08 DIAGNOSIS — E872 Acidosis: Secondary | ICD-10-CM | POA: Insufficient documentation

## 2019-05-08 DIAGNOSIS — M6740 Ganglion, unspecified site: Secondary | ICD-10-CM | POA: Insufficient documentation

## 2019-05-08 DIAGNOSIS — Q2572 Congenital pulmonary arteriovenous malformation: Secondary | ICD-10-CM | POA: Insufficient documentation

## 2019-05-08 DIAGNOSIS — K580 Irritable bowel syndrome with diarrhea: Secondary | ICD-10-CM | POA: Insufficient documentation

## 2019-05-08 DIAGNOSIS — E049 Nontoxic goiter, unspecified: Secondary | ICD-10-CM | POA: Insufficient documentation

## 2019-05-08 DIAGNOSIS — H04129 Dry eye syndrome of unspecified lacrimal gland: Secondary | ICD-10-CM | POA: Insufficient documentation

## 2019-05-08 NOTE — Progress Notes (Signed)
05/08/2019 1:45 PM   Molly Figueroa Molly Figueroa 1931-07-30 [id number]  Referring provider: Donnamarie Rossetti, PA-C [place],  [place] [id number]  Chief Complaint  Patient presents with  . Follow-up    27month    HPI:  F/u -   1) recurrent UTI -  She is on vaginal estrogen since Nov 2019. She's been having "UTI" for years. She gets dysuria and abx help. She typically voids with a good stream and no straining, but doesn't empty. She has seen the downside to abx with recent diarrhea and C. Diff. [facility]. Inthe past her post void residuals have been around 200 mL. In order to help empty,theytried clean intermittent catheterization (patient thought this increased her infections due to difficulty catheterizing her self cleanly), flomax (did not tolerate side effects), tibial nerve stimulation (no improvement in emptying). Prior cystoscopies (2012, 2014, 2016) and CT workups (2019) have been unrevealing.CT A/P 04/2018 at [facility] for possible GI bleed report mentions no hydronephrosis or stones. 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 No gross hematuria. She drinksmainlywater. She has IBS, diarrhea.  2) LUTS - Shealsohas some urgency and UUI.She started PT/OT at [facility] Feb 2020.NG risk includes CVA. Prior hysterectomy.Mobility issues/waler use.Recent hip replacement.   She returns and completed pelvic floor PT. She started solifenacin 5 mg and has done well. Pelvic pain and freq and urgency resolved. No gross hematuria or dysuria.      PMH: Past Medical History:  Diagnosis Date  . Hypertension     Surgical History: Past Surgical History:  Procedure Laterality Date  . HIP ARTHROPLASTY      Home Medications:  Allergies as of 05/08/2019      Reactions   Amlodipine Swelling   Codeine Nausea And Vomiting   Gabapentin Itching   Gentak [gentamicin Sulfate] Itching   Phenothiazines Nausea And Vomiting   Tobradex  [tobramycin-dexamethasone] Itching   Wellbutrin [bupropion] Swelling   Throat pain   Cefuroxime Axetil Rash   Macrolides And Ketolides Rash   Sulfa Antibiotics Rash      Medication List       Accurate as of May 08, 2019  1:45 PM. If you have any questions, ask your nurse or doctor.        STOP taking these medications   estradiol 0.1 MG/GM vaginal cream Commonly known as: ESTRACE Stopped by: Festus Aloe, MD   vancomycin 125 MG capsule Commonly known as: VANCOCIN Stopped by: Festus Aloe, MD     TAKE these medications   aspirin EC 81 MG tablet Take by mouth.   belladonna-opium 16.2-30 MG suppository Commonly known as: B&O SUPPRETTES UNW AND I 1 SUP REC Q 8 H PRN   dicyclomine 20 MG tablet Commonly known as: BENTYL Take by mouth.   escitalopram 20 MG tablet Commonly known as: LEXAPRO Take by mouth.   lidocaine 5 % Commonly known as: [place] onto the skin.   meloxicam 7.5 MG tablet Commonly known as: MOBIC Take by mouth.   Olopatadine HCl 0.2 % Soln Apply to eye.   omeprazole 20 MG capsule Commonly known as: PRILOSEC Take by mouth.   solifenacin 5 MG tablet Commonly known as: VESICARE Take 1 tablet (5 mg total) by mouth daily.   telmisartan 20 MG tablet Commonly known as: MICARDIS Take by mouth.   triamterene-hydrochlorothiazide 37.5-25 MG tablet Commonly known as: MAXZIDE-25 Take by mouth.       Allergies:  Allergies  Allergen Reactions  . Amlodipine Swelling  . Codeine Nausea And Vomiting  . Gabapentin Itching  . Gentak [Gentamicin Sulfate] Itching  . Phenothiazines Nausea And Vomiting  . Tobradex [Tobramycin-Dexamethasone] Itching  . Wellbutrin [Bupropion] Swelling    Throat pain   . Cefuroxime Axetil Rash  . Macrolides And Ketolides Rash  . Sulfa Antibiotics Rash    Family History: Family History  Problem Relation Age of Onset  . Bladder Cancer Neg Hx   . Kidney cancer Neg Hx     Social History:   reports that she has never smoked. She has never used smokeless tobacco. She reports previous alcohol use. She reports that she does not use drugs.  ROS: UROLOGY Frequent Urination?: No Hard to postpone urination?: No Burning/pain with urination?: No Get up at night to urinate?: No Leakage of urine?: No Urine stream starts and stops?: No Trouble starting stream?: No Do you have to strain to urinate?: No Blood in urine?: No Urinary tract infection?: No Sexually transmitted disease?: No Injury to kidneys or bladder?: No Painful intercourse?: No Weak stream?: No Currently pregnant?: No Vaginal bleeding?: No Last menstrual period?: n  Gastrointestinal Nausea?: No Vomiting?: No Indigestion/heartburn?: Yes Diarrhea?: No Constipation?: No  Constitutional Fever: No Night sweats?: Yes Weight loss?: No Fatigue?: No  Skin Skin rash/lesions?: No Itching?: No  Eyes Blurred vision?: No Double vision?: No  Ears/Nose/Throat Sore throat?: No Sinus problems?: No  Hematologic/Lymphatic Swollen glands?: No Easy bruising?: No  Cardiovascular Leg swelling?: Yes Chest pain?: No  Respiratory Cough?: No Shortness of breath?: No  Endocrine Excessive thirst?: No  Musculoskeletal Back pain?: Yes Joint pain?: No  Neurological Headaches?: No Dizziness?: Yes  Psychologic Depression?: No Anxiety?: No  Physical Exam: BP 129/77   Pulse 73   Wt 69.9 kg   BMI 28.17 kg/m   Constitutional:  Alert and oriented, No acute distress. HEENT: [place] AT, moist mucus membranes.  Trachea midline, no masses. Cardiovascular: No clubbing, cyanosis, or edema. Respiratory: Normal respiratory effort, no increased work of breathing. GI: Abdomen is soft, nontender, nondistended, no abdominal masses Skin: No rashes, bruises or suspicious lesions. Neurologic: Grossly intact, no focal deficits, moving all 4 extremities. Psychiatric: Normal mood and affect.  Laboratory Data: Lab Results   Component Value Date   WBC 8.0 01/27/2019   HGB 10.0 (L) 01/27/2019   HCT 30.8 (L) 01/27/2019   MCV 90.6 01/27/2019   PLT 312 01/27/2019    Lab Results  Component Value Date   CREATININE 1.65 (H) 01/27/2019    No results found for: PSA  No results found for: TESTOSTERONE  No results found for: HGBA1C  Urinalysis    Component Value Date/Time   APPEARANCEUR Clear 02/01/2019 1428   GLUCOSEU Negative 02/01/2019 1428   BILIRUBINUR Negative 02/01/2019 1428   PROTEINUR Negative 02/01/2019 1428   NITRITE Negative 02/01/2019 1428   LEUKOCYTESUR Trace (A) 02/01/2019 1428    Lab Results  Component Value Date   LABMICR See below: 02/01/2019   WBCUA 6-10 (A) 02/01/2019   RBCUA None seen 10/24/2018   LABEPIT None seen 02/01/2019   BACTERIA None seen 02/01/2019    Pertinent Imaging: n/a No results found for this or any previous visit. No results found for this or any previous visit. No results found for this or any previous visit. No results found for this or any previous visit. No results found for this or any previous visit. No results found for this or any previous visit. No results found  for this or any previous visit. No results found for this or any previous visit.  Assessment & Plan:    Urinary frequency - doing much better on solifenacin 5 mg. She will continue. See in 1 year or sooner if issues.   No follow-ups on file.  Festus Aloe, MD  [facility] [address] [place], [place] [address] [phone]

## 2019-05-08 NOTE — Patient Instructions (Signed)

## 2019-05-22 ENCOUNTER — Emergency Department: Payer: Medicare Other

## 2019-05-22 ENCOUNTER — Observation Stay
Admission: EM | Admit: 2019-05-22 | Discharge: 2019-05-23 | Disposition: A | Discharge status: Home / Self Care | Payer: Medicare Other | Attending: Specialist | Admitting: Specialist

## 2019-05-22 ENCOUNTER — Observation Stay: Payer: Medicare Other

## 2019-05-22 ENCOUNTER — Other Ambulatory Visit: Payer: Self-pay

## 2019-05-22 DIAGNOSIS — Z7982 Long term (current) use of aspirin: Secondary | ICD-10-CM | POA: Insufficient documentation

## 2019-05-22 DIAGNOSIS — E785 Hyperlipidemia, unspecified: Secondary | ICD-10-CM | POA: Insufficient documentation

## 2019-05-22 DIAGNOSIS — K219 Gastro-esophageal reflux disease without esophagitis: Secondary | ICD-10-CM | POA: Diagnosis not present

## 2019-05-22 DIAGNOSIS — Z96641 Presence of right artificial hip joint: Secondary | ICD-10-CM | POA: Diagnosis not present

## 2019-05-22 DIAGNOSIS — I10 Essential (primary) hypertension: Secondary | ICD-10-CM | POA: Insufficient documentation

## 2019-05-22 DIAGNOSIS — K589 Irritable bowel syndrome without diarrhea: Secondary | ICD-10-CM | POA: Diagnosis not present

## 2019-05-22 DIAGNOSIS — M1990 Unspecified osteoarthritis, unspecified site: Secondary | ICD-10-CM | POA: Insufficient documentation

## 2019-05-22 DIAGNOSIS — M79642 Pain in left hand: Secondary | ICD-10-CM

## 2019-05-22 DIAGNOSIS — Z20828 Contact with and (suspected) exposure to other viral communicable diseases: Secondary | ICD-10-CM | POA: Insufficient documentation

## 2019-05-22 DIAGNOSIS — Z791 Long term (current) use of non-steroidal anti-inflammatories (NSAID): Secondary | ICD-10-CM | POA: Diagnosis not present

## 2019-05-22 DIAGNOSIS — M810 Age-related osteoporosis without current pathological fracture: Secondary | ICD-10-CM | POA: Diagnosis not present

## 2019-05-22 DIAGNOSIS — Z66 Do not resuscitate: Secondary | ICD-10-CM | POA: Insufficient documentation

## 2019-05-22 DIAGNOSIS — S2222XA Fracture of body of sternum, initial encounter for closed fracture: Secondary | ICD-10-CM | POA: Diagnosis present

## 2019-05-22 DIAGNOSIS — R778 Other specified abnormalities of plasma proteins: Secondary | ICD-10-CM

## 2019-05-22 DIAGNOSIS — Z79899 Other long term (current) drug therapy: Secondary | ICD-10-CM | POA: Diagnosis not present

## 2019-05-22 DIAGNOSIS — R7989 Other specified abnormal findings of blood chemistry: Secondary | ICD-10-CM | POA: Insufficient documentation

## 2019-05-22 DIAGNOSIS — M6281 Muscle weakness (generalized): Secondary | ICD-10-CM | POA: Insufficient documentation

## 2019-05-22 DIAGNOSIS — S2220XA Unspecified fracture of sternum, initial encounter for closed fracture: Secondary | ICD-10-CM | POA: Diagnosis not present

## 2019-05-22 LAB — COMPREHENSIVE METABOLIC PANEL
ALT: 20 U/L (ref 0–44)
AST: 23 U/L (ref 15–41)
Albumin: 3.9 g/dL (ref 3.5–5.0)
Alkaline Phosphatase: 59 U/L (ref 38–126)
Anion gap: 9 (ref 5–15)
BUN: 15 mg/dL (ref 8–23)
CO2: 26 mmol/L (ref 22–32)
Calcium: 9.7 mg/dL (ref 8.9–10.3)
Chloride: 105 mmol/L (ref 98–111)
Creatinine, Ser: 0.91 mg/dL (ref 0.44–1.00)
GFR calc Af Amer: >60 mL/min (ref >60)
GFR calc non Af Amer: 57 mL/min — ABNORMAL LOW (ref >60)
Glucose, Bld: 87 mg/dL (ref 70–99)
Potassium: 3.9 mmol/L (ref 3.5–5.1)
Sodium: 140 mmol/L (ref 135–145)
Total Bilirubin: 0.6 mg/dL (ref 0.3–1.2)
Total Protein: 6.8 g/dL (ref 6.5–8.1)

## 2019-05-22 LAB — CBC
HCT: 42 % (ref 36.0–46.0)
Hemoglobin: 13.9 g/dL (ref 12.0–15.0)
MCH: 28.5 pg (ref 26.0–34.0)
MCHC: 33.1 g/dL (ref 30.0–36.0)
MCV: 86.2 fL (ref 80.0–100.0)
Platelets: 220 10*3/uL (ref 150–400)
RBC: 4.87 MIL/uL (ref 3.87–5.11)
RDW: 13.8 % (ref 11.5–15.5)
WBC: 8.7 10*3/uL (ref 4.0–10.5)
nRBC: 0 % (ref 0.0–0.2)

## 2019-05-22 LAB — TROPONIN I (HIGH SENSITIVITY)
Troponin I (High Sensitivity): 39 ng/L — ABNORMAL HIGH (ref <18)
Troponin I (High Sensitivity): 8 ng/L (ref <18)

## 2019-05-22 LAB — MRSA PCR SCREENING: MRSA by PCR: NEGATIVE

## 2019-05-22 MED ORDER — TRAMADOL HCL 50 MG PO TABS
50.0000 mg | ORAL_TABLET | Freq: Four times a day (QID) | ORAL | Status: DC | PRN
  Administered 2019-05-22 – 2019-05-23 (×2): 50 mg via ORAL
  Filled 2019-05-22 (×2): qty 1

## 2019-05-22 MED ORDER — ENOXAPARIN SODIUM 40 MG/0.4ML ~~LOC~~ SOLN
40.0000 mg | SUBCUTANEOUS | Status: DC
  Administered 2019-05-22: 40 mg via SUBCUTANEOUS
  Filled 2019-05-22: qty 0.4

## 2019-05-22 MED ORDER — RISAQUAD PO CAPS
1.0000 | ORAL_CAPSULE | Freq: Every day | ORAL | Status: DC
  Administered 2019-05-23: 1 via ORAL
  Filled 2019-05-22: qty 1

## 2019-05-22 MED ORDER — IRBESARTAN 150 MG PO TABS
150.0000 mg | ORAL_TABLET | Freq: Every day | ORAL | Status: DC
  Administered 2019-05-23: 150 mg via ORAL
  Filled 2019-05-22: qty 1

## 2019-05-22 MED ORDER — DICLOFENAC SODIUM 1 % TD GEL
2.0000 g | Freq: Four times a day (QID) | TRANSDERMAL | Status: DC
  Administered 2019-05-22 – 2019-05-23 (×2): 2 g via TOPICAL
  Filled 2019-05-22: qty 100

## 2019-05-22 MED ORDER — LORATADINE 10 MG PO TABS
10.0000 mg | ORAL_TABLET | Freq: Every day | ORAL | Status: DC
  Administered 2019-05-23: 10 mg via ORAL
  Filled 2019-05-22: qty 1

## 2019-05-22 MED ORDER — IOHEXOL 350 MG/ML SOLN
75.0000 mL | Freq: Once | INTRAVENOUS | Status: AC | PRN
  Administered 2019-05-22: 15:00:00 75 mL via INTRAVENOUS

## 2019-05-22 MED ORDER — PANTOPRAZOLE SODIUM 40 MG PO TBEC
40.0000 mg | DELAYED_RELEASE_TABLET | Freq: Every day | ORAL | Status: DC
  Administered 2019-05-23: 10:00:00 40 mg via ORAL
  Filled 2019-05-22: qty 1

## 2019-05-22 MED ORDER — MORPHINE SULFATE (PF) 2 MG/ML IV SOLN: 2.0000 mg | INTRAVENOUS | Status: DC | PRN

## 2019-05-22 MED ORDER — MORPHINE SULFATE (PF) 2 MG/ML IV SOLN
2.0000 mg | Freq: Once | INTRAVENOUS | Status: AC
  Administered 2019-05-22: 14:00:00 2 mg via INTRAVENOUS
  Filled 2019-05-22: qty 1

## 2019-05-22 MED ORDER — ONDANSETRON HCL 4 MG PO TABS: 4.0000 mg | ORAL_TABLET | Freq: Four times a day (QID) | ORAL | Status: DC | PRN

## 2019-05-22 MED ORDER — TRIAMTERENE-HCTZ 37.5-25 MG PO TABS
0.5000 | ORAL_TABLET | Freq: Every day | ORAL | Status: DC
  Administered 2019-05-23: 0.5 via ORAL
  Filled 2019-05-22: qty 0.5

## 2019-05-22 MED ORDER — VITAMIN D 25 MCG (1000 UNIT) PO TABS
1000.0000 [IU] | ORAL_TABLET | Freq: Every day | ORAL | Status: DC
  Administered 2019-05-23: 10:00:00 1000 [IU] via ORAL
  Filled 2019-05-22: qty 1

## 2019-05-22 MED ORDER — OLOPATADINE HCL 0.1 % OP SOLN
1.0000 [drp] | Freq: Two times a day (BID) | OPHTHALMIC | Status: DC | PRN
  Filled 2019-05-22: qty 5

## 2019-05-22 MED ORDER — DARIFENACIN HYDROBROMIDE ER 7.5 MG PO TB24
7.5000 mg | ORAL_TABLET | Freq: Every day | ORAL | Status: DC
  Administered 2019-05-23: 7.5 mg via ORAL
  Filled 2019-05-22: qty 1

## 2019-05-22 MED ORDER — ASPIRIN EC 81 MG PO TBEC
81.0000 mg | DELAYED_RELEASE_TABLET | Freq: Every day | ORAL | Status: DC
  Administered 2019-05-23: 81 mg via ORAL
  Filled 2019-05-22: qty 1

## 2019-05-22 MED ORDER — ONDANSETRON HCL 4 MG/2ML IJ SOLN
4.0000 mg | Freq: Once | INTRAMUSCULAR | Status: AC
  Administered 2019-05-22: 4 mg via INTRAVENOUS
  Filled 2019-05-22: qty 2

## 2019-05-22 MED ORDER — ACETAMINOPHEN 650 MG RE SUPP: 650.0000 mg | Freq: Four times a day (QID) | RECTAL | Status: DC | PRN

## 2019-05-22 MED ORDER — CHOLESTYRAMINE 4 G PO PACK
4.0000 g | PACK | Freq: Every day | ORAL | Status: DC
  Administered 2019-05-23: 4 g via ORAL
  Filled 2019-05-22: qty 1

## 2019-05-22 MED ORDER — MORPHINE SULFATE (PF) 4 MG/ML IV SOLN
4.0000 mg | Freq: Once | INTRAVENOUS | Status: AC
  Administered 2019-05-22: 16:00:00 4 mg via INTRAVENOUS
  Filled 2019-05-22: qty 1

## 2019-05-22 MED ORDER — ADULT MULTIVITAMIN W/MINERALS CH
1.0000 | ORAL_TABLET | Freq: Every day | ORAL | Status: DC
  Administered 2019-05-23: 1 via ORAL
  Filled 2019-05-22: qty 1

## 2019-05-22 MED ORDER — ONDANSETRON HCL 4 MG/2ML IJ SOLN: 4.0000 mg | Freq: Four times a day (QID) | INTRAMUSCULAR | Status: DC | PRN

## 2019-05-22 MED ORDER — POLYETHYLENE GLYCOL 3350 17 G PO PACK: 17.0000 g | PACK | Freq: Every day | ORAL | Status: DC | PRN

## 2019-05-22 MED ORDER — ESCITALOPRAM OXALATE 10 MG PO TABS
20.0000 mg | ORAL_TABLET | Freq: Every day | ORAL | Status: DC
  Administered 2019-05-23: 20 mg via ORAL
  Filled 2019-05-22: qty 2

## 2019-05-22 MED ORDER — ACETAMINOPHEN 325 MG PO TABS: 650.0000 mg | ORAL_TABLET | Freq: Four times a day (QID) | ORAL | Status: DC | PRN

## 2019-05-22 MED ORDER — LIDOCAINE 5 % EX PTCH
1.0000 | MEDICATED_PATCH | Freq: Every day | CUTANEOUS | Status: DC
  Administered 2019-05-23: 10:00:00 1 via TRANSDERMAL
  Filled 2019-05-22 (×2): qty 1
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
## 2019-05-22 NOTE — ED Notes (Signed)
Patient transported to CT 
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
## 2019-05-22 NOTE — ED Notes (Signed)
Pt up to toilet at this time w/o difficulty.  

## 2019-05-23 DIAGNOSIS — S2220XA Unspecified fracture of sternum, initial encounter for closed fracture: Secondary | ICD-10-CM | POA: Diagnosis not present

## 2019-05-23 LAB — BASIC METABOLIC PANEL
Anion gap: 11 (ref 5–15)
BUN: 14 mg/dL (ref 8–23)
CO2: 24 mmol/L (ref 22–32)
Calcium: 9.2 mg/dL (ref 8.9–10.3)
Chloride: 105 mmol/L (ref 98–111)
Creatinine, Ser: 0.86 mg/dL (ref 0.44–1.00)
GFR calc Af Amer: >60 mL/min (ref >60)
GFR calc non Af Amer: >60 mL/min (ref >60)
Glucose, Bld: 109 mg/dL — ABNORMAL HIGH (ref 70–99)
Potassium: 3.7 mmol/L (ref 3.5–5.1)
Sodium: 140 mmol/L (ref 135–145)

## 2019-05-23 LAB — CBC
HCT: 39.7 % (ref 36.0–46.0)
Hemoglobin: 12.8 g/dL (ref 12.0–15.0)
MCH: 28.4 pg (ref 26.0–34.0)
MCHC: 32.2 g/dL (ref 30.0–36.0)
MCV: 88.2 fL (ref 80.0–100.0)
Platelets: 200 10*3/uL (ref 150–400)
RBC: 4.5 MIL/uL (ref 3.87–5.11)
RDW: 14 % (ref 11.5–15.5)
WBC: 5.6 10*3/uL (ref 4.0–10.5)
nRBC: 0 % (ref 0.0–0.2)

## 2019-05-23 LAB — SARS CORONAVIRUS 2 (TAT 6-24 HRS): SARS Coronavirus 2: NEGATIVE

## 2019-05-23 MED ORDER — TRAMADOL HCL 50 MG PO TABS: 50.0000 mg | ORAL_TABLET | Freq: Four times a day (QID) | ORAL | 0 refills | Status: DC | PRN
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
## 2019-05-23 NOTE — TOC Transition Note (Signed)
Transition of Care [facility]) - CM/SW Discharge Note   Patient Details  Name: KENZLEIGH SEDAM MRN: [id number] Date of Birth: July 29, 1931  Transition of Care [facility]) CM/SW Contact:  Su Hilt, RN Phone Number: 05/23/2019, 10:21 AM   Clinical Narrative:    Met with the patient to discuss DC needs and plan, She lives at [facility] [address], she still drives and her sister helps her when needed.  She has All the DME she needs at home including WC, RW, Shower seat, cane and BSC, no additional needs She said she has PT and OT already set up and does not need additional orders She stated that she does not have any needs She can afford her medications and is up to date with PCP   Final next level of care: Home w Home Health Services Barriers to Discharge: Barriers Resolved   Patient Goals and CMS Choice Patient states their goals for this hospitalization and ongoing recovery are:: go home to [facility].gov Compare Post Acute Care list provided to:: Patient Choice offered to / list presented to : Patient  Discharge Placement                       Discharge Plan and Services   Discharge Planning Services: CM Consult Post Acute Care Choice: Home Health          DME Arranged: N/A         HH Arranged: NA          Social Determinants of Health (SDOH) Interventions     Readmission Risk Interventions No flowsheet data found.

## 2019-05-23 NOTE — Progress Notes (Signed)
Discharge note: Writer wheeled patient to medical mall entrance with cane, discharge instructions with script, and personal belongings where her sister was waiting to transport patient home.

## 2019-05-23 NOTE — Progress Notes (Signed)
Progress note/Discharge: Writer went over discharge instructions with patient, gave her script for Tramadol and removed IV.  Awaiting her sister to arrive to transport her out to medical mall.
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
## 2019-07-31 ENCOUNTER — Other Ambulatory Visit: Payer: Self-pay | Admitting: Family Medicine

## 2019-07-31 DIAGNOSIS — R221 Localized swelling, mass and lump, neck: Secondary | ICD-10-CM

## 2019-08-13 ENCOUNTER — Other Ambulatory Visit: Payer: Self-pay

## 2019-08-13 ENCOUNTER — Ambulatory Visit
Admission: RE | Admit: 2019-08-13 | Discharge: 2019-08-13 | Disposition: A | Discharge status: Home / Self Care | Payer: Medicare Other | Source: Ambulatory Visit | Attending: Family Medicine | Admitting: Family Medicine

## 2019-08-13 DIAGNOSIS — R221 Localized swelling, mass and lump, neck: Secondary | ICD-10-CM

## 2019-08-13 MED ORDER — IOHEXOL 300 MG/ML  SOLN
75.0000 mL | Freq: Once | INTRAMUSCULAR | Status: AC | PRN
  Administered 2019-08-13: 75 mL via INTRAVENOUS

## 2019-09-12 ENCOUNTER — Other Ambulatory Visit: Payer: Self-pay | Admitting: Otolaryngology

## 2019-09-12 DIAGNOSIS — K219 Gastro-esophageal reflux disease without esophagitis: Secondary | ICD-10-CM

## 2019-09-12 DIAGNOSIS — R1310 Dysphagia, unspecified: Secondary | ICD-10-CM

## 2019-09-26 ENCOUNTER — Other Ambulatory Visit: Payer: Self-pay

## 2019-09-26 ENCOUNTER — Ambulatory Visit
Admission: RE | Admit: 2019-09-26 | Discharge: 2019-09-26 | Disposition: A | Discharge status: Home / Self Care | Payer: Medicare Other | Source: Ambulatory Visit | Attending: Otolaryngology | Admitting: Otolaryngology

## 2019-09-26 DIAGNOSIS — R1312 Dysphagia, oropharyngeal phase: Secondary | ICD-10-CM

## 2019-09-26 DIAGNOSIS — K219 Gastro-esophageal reflux disease without esophagitis: Secondary | ICD-10-CM | POA: Diagnosis present

## 2019-09-26 NOTE — Therapy (Signed)
[facility] [place], [place], [address] Phone: [phone]   Fax:     Modified Barium Swallow  Patient Details  Name: Molly Figueroa MRN: [id number] Date of Birth: 05-Nov-1930 No data recorded  Encounter Date: 09/26/2019  End of Session - 09/26/19 1244    Visit Number  1    Number of Visits  1    Date for SLP Re-Evaluation  09/26/19    SLP Start Time  1115    SLP Stop Time   1200    SLP Time Calculation (min)  45 min    Activity Tolerance  Patient tolerated treatment well       Past Medical History:  Diagnosis Date  . Hypertension     Past Surgical History:  Procedure Laterality Date  . HIP ARTHROPLASTY    . JOINT REPLACEMENT  01/22/2019   R hip replacement    There were no vitals filed for this visit.        Subjective: Patient behavior: (alertness, ability to follow instructions, etc.):  The patient is alert, able to verbalize her swallowing complaints, and follow directions.  Chief complaint: Patient complains of dysphagia and neck swelling.  Recent US and CT scan show no abnormality of neck or thyroid.   Laryngoscopy was WNL.   Objective:  Radiological Procedure: A videoflouroscopic evaluation of oral-preparatory, reflex initiation, and pharyngeal phases of the swallow was performed; as well as a screening of the upper esophageal phase.  I. POSTURE: Upright in MBS chair  II. VIEW: Lateral  III. COMPENSATORY STRATEGIES: Spontaneous dry swallows aid vallecular clearance  IV. BOLUSES ADMINISTERED:   Thin Liquid: 1 small, 4 rapid, consecutive   Nectar-thick Liquid: 1 moderate   Honey-thick Liquid: DNT   Puree: 2 teaspoon presentations   Mechanical Soft: 1/4 graham cracker in applesauce  V. RESULTS OF EVALUATION: A. ORAL PREPARATORY PHASE: (The lips, tongue, and velum are observed for strength and coordination)       **Overall Severity Rating: within normal  limits   B. SWALLOW INITIATION/REFLEX: (The reflex is normal if "triggered" by the time the bolus reached the base of the tongue)  **Overall Severity Rating:  C. PHARYNGEAL PHASE: (Pharyngeal function is normal if the bolus shows rapid, smooth, and continuous transit through the pharynx and there is no pharyngeal residue after the swallow)  **Overall Severity Rating: Mild-moderate; triggers while falling from the valleculae to the pyriform sinuses  D. LARYNGEAL PENETRATION: (Material entering into the laryngeal inlet/vestibule but not aspirated) TRANSIENT 4/4 consecutive trials  E. ASPIRATION: None  F. ESOPHAGEAL PHASE: (Screening of the upper esophagus): Marland Kitchen  In the cervical esophagus there is a finger-like protrusion along the posterior wall during swallow (does not impede flow of boluses) consistent with prominent cricopharyngeus.   ASSESSMENT: This 86 year old woman; with complaint of dysphagia and neck swelling; is presenting with mild oropharyngeal dysphagia characterized by delayed pharyngeal swallow initiation, incomplete epiglottic inversion, trace-to-mild vallecular residue, and transient laryngeal penetration with consecutive drinking.  Oral control of the bolus including oral hold, rotary mastication, and anterior to posterior transfer is within functional limits.   Other aspects of the pharyngeal stage of swallowing including tongue base retraction, hyolaryngeal excursion, and duration/amplitude of UES opening are within normal limits.  There is no observed tracheal aspiration.  In the cervical esophagus there is a finger-like protrusion along the posterior wall during swallow (does not impede flow of boluses) consistent with prominent cricopharyngeus. Delayed pharyngeal swallow initiation is  consistent with effects of laryngopharyngeal reflux (inflammation, edema, and resultant decreased sensation of the larynx and pharynx).  The patient is not at significant risk for prandial aspiration  and oropharyngeal findings do not indicate a cause for the patient's complaints.    PLAN/RECOMMENDATIONS:   A. Diet: Regular   B. Swallowing Precautions: Dry swallows   C. Recommended consultation to: follow up with MDs as recommended   D. Therapy recommendations: speech therapy is not indicated   E. Results and recommendations were discussed with the patient immediately following the study and the final report routed to the referring MD.    Oropharyngeal dysphagia - Plan: DG OP Swallowing Func-Medicare/Speech Path, DG OP Swallowing Func-Medicare/Speech Path  Gastroesophageal reflux disease without esophagitis - Plan: DG OP Swallowing Func-Medicare/Speech Path, DG OP Swallowing Func-Medicare/Speech Path        Problem List Patient Active Problem List   Diagnosis Date Noted  . Closed fracture of body of sternum 05/22/2019  . Urethral caruncle 05/08/2019  . Urinary tract infectious disease 05/08/2019  . Transient ischemic attack 05/08/2019  . Radial styloid tenosynovitis 05/08/2019  . 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 Osteoporosis 05/08/2019  . Lumbar radiculopathy 05/08/2019  . Irritable bowel syndrome with diarrhea 05/08/2019  . Injury of kidney 05/08/2019  . Essential hypertension 05/08/2019  . Hyperlipidemia 05/08/2019  . Hyperchloremic acidosis 05/08/2019  . History of appendectomy 05/08/2019  . Hip pain 05/08/2019  . Hammer toe 05/08/2019  . Goiter 05/08/2019  . Generalized osteoarthritis 05/08/2019  . Ganglion of joint 05/08/2019  . Dry eye syndrome 05/08/2019  . Closed Colles' fracture 05/08/2019  . Cervical radiculopathy 05/08/2019  . Bilateral carpal tunnel syndrome 05/08/2019  . Benign neoplastic disease 05/08/2019  . Arthritis of hip 05/08/2019  . Anemia 05/08/2019  . Adenomatous polyp of colon 05/08/2019  . History of total hip arthroplasty 03/04/2019  . Avascular necrosis of bone ([place]) 01/02/2019  . Pyelonephritis, acute  09/04/2018  . Recurrent colitis due to Clostridium difficile 06/18/2018  . Enthesopathy of left hip region 10/14/2017  . Strain of foot, left 06/19/2017  . Closed nondisplaced fracture of head of left radius with routine healing 05/30/2017  . Vitamin D insufficiency 06/21/2016  . Low vitamin B12 level 06/21/2016  . H/O allergic drug reaction 12/27/2014  . Nontoxic multinodular goiter 12/16/2014  . Major depressive disorder, recurrent episode, moderate ([place]) 06/03/2014  . Incomplete emptying of bladder 11/13/2013  . Cystocele 11/13/2013  . 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 Pseudophakia of both eyes 01/04/2013  . GERD (gastroesophageal reflux disease) 03/29/2012  . Tinnitus of both ears 03/13/2012  . Sensorineural hearing loss of both ears 03/13/2012  . BPPV (benign paroxysmal positional vertigo) 03/13/2012  . Subacromial bursitis 11/15/2011  . Adhesive capsulitis of right shoulder 11/15/2011  . Incontinence of sphincter ani 08/03/2011  . Allergic conjunctivitis of both eyes 07/26/2011  . Voiding dysfunction 07/04/2011  . Recurrent UTI 07/04/2011  . Fatigue 05/19/2011  . TMJPDS (temporomandibular joint pain dysfunction syndrome) 02/28/2011  . Increased frequency of urination 02/28/2011   Leroy Sea, MS/CCC- SLP  Lou Miner 09/26/2019, 12:45 PM  [facility] [place], [place], [address] Phone: [phone]   Fax:     Name: Molly Figueroa MRN: [id number] Date of Birth: October 11, 1930

## 2019-10-14 IMAGING — MR MRI OF THE RIGHT HIP WITHOUT CONTRAST
4 of 5 series · 25 of 40 positions shown · non-contrast
Comparison: None.

CLINICAL DATA: Right hip pain

EXAM:
MR OF THE RIGHT HIP WITHOUT CONTRAST
TECHNIQUE: Multiplanar, multisequence MR imaging was performed. No intravenous
contrast was administered.

[Series 2: T1 · coronal · right · 4.0mm · 0.47mm/px · 4 of 38 slices shown]
[im 1/38]
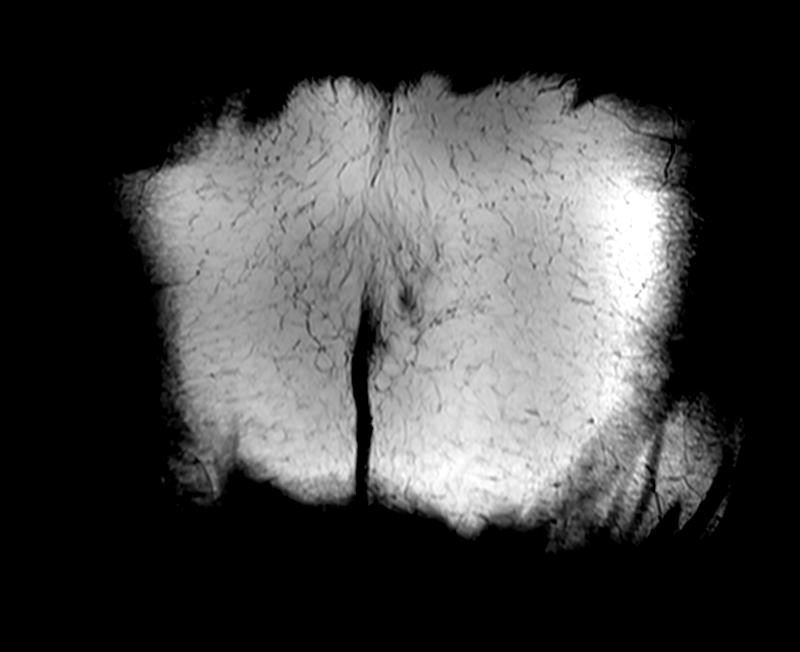
[im 5/38]
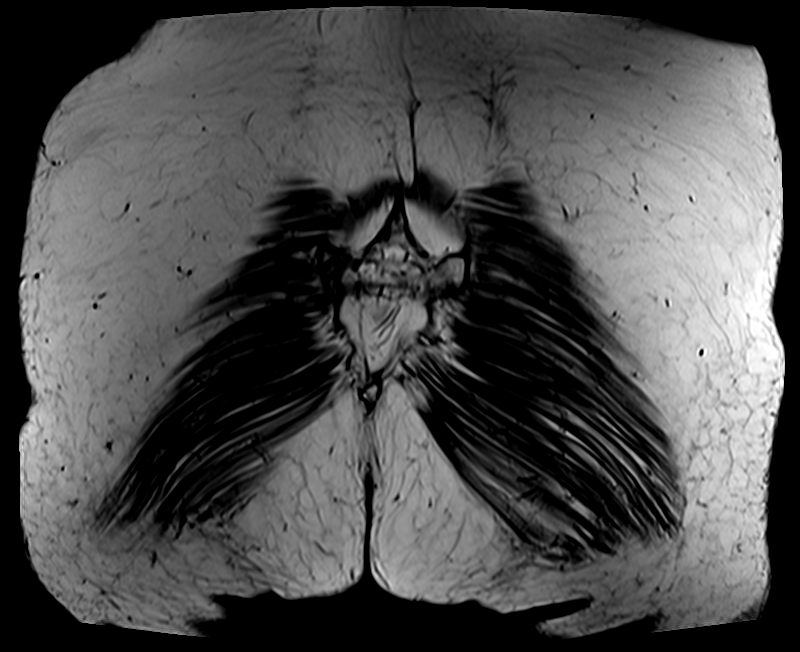
[im 21/38]
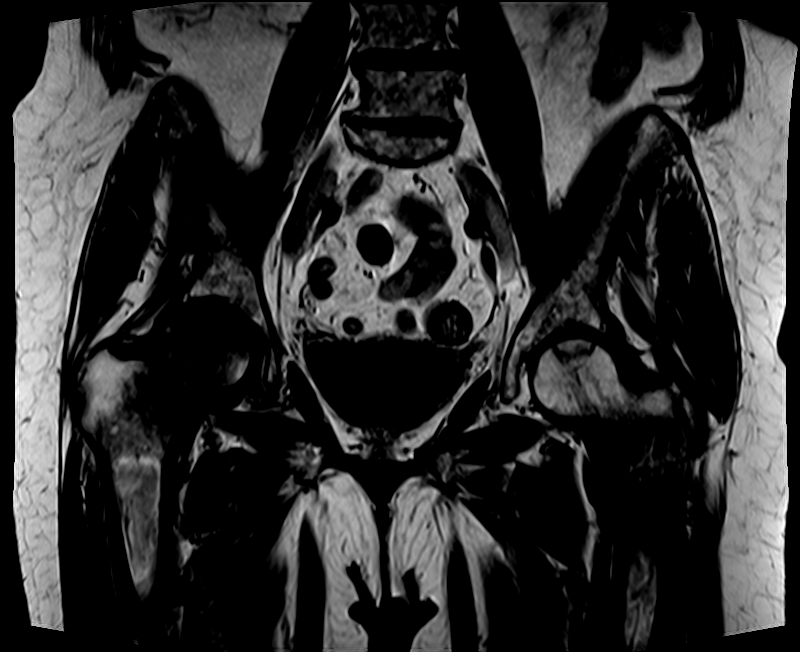
[im 33/38]
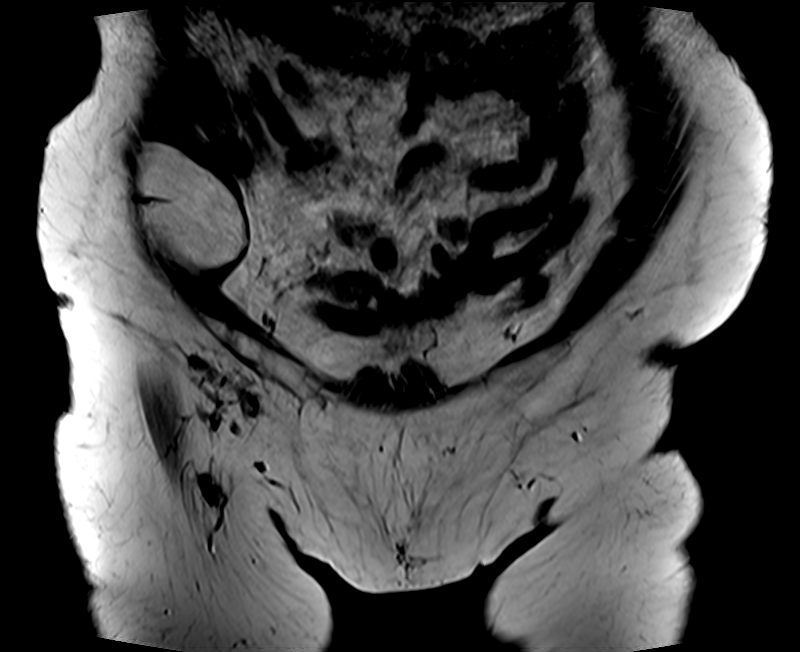

[Series 4: T2 fat-sat · axial · right · 4.0mm · 0.35mm/px · z∈[-53,+92]mm · 7 of 30 slices shown]
[im 1/30]
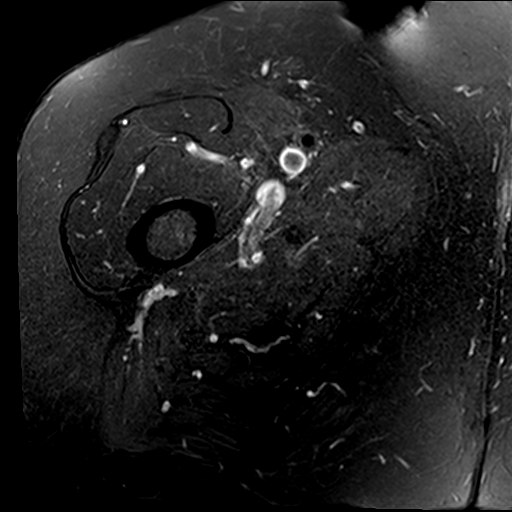
[im 5/30]
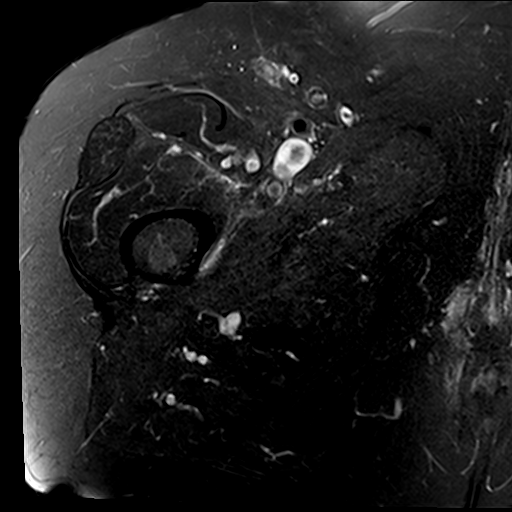
[im 10/30]
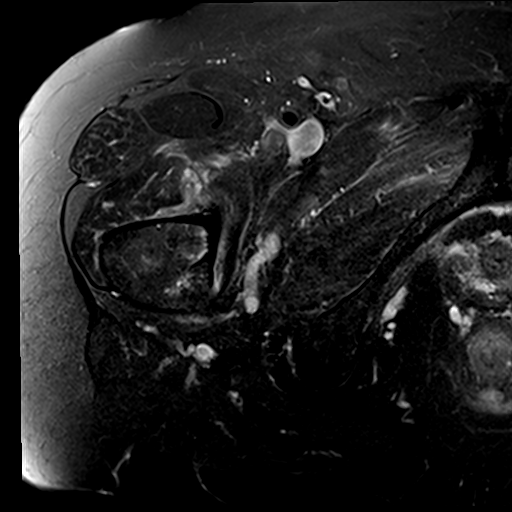
[im 15/30]
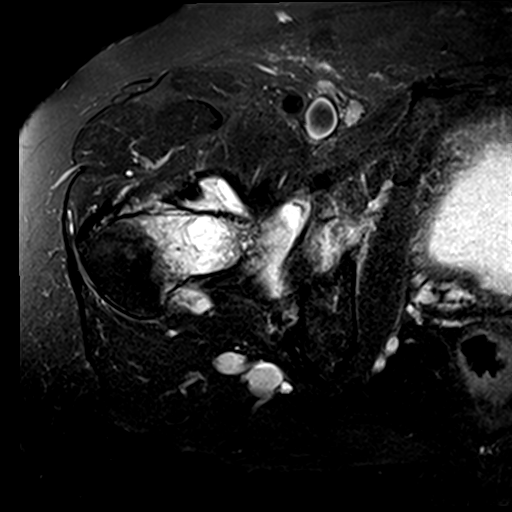
[im 20/30]
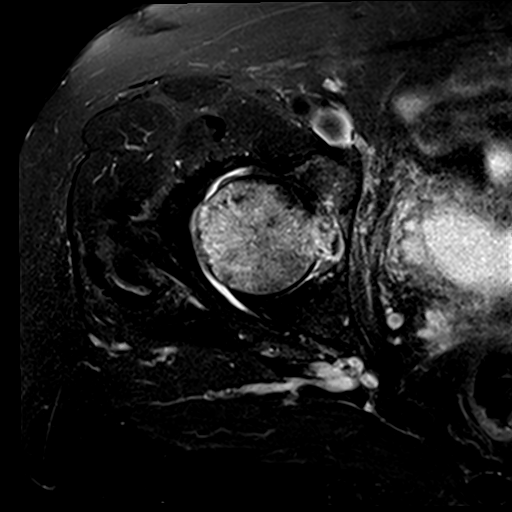
[im 25/30]
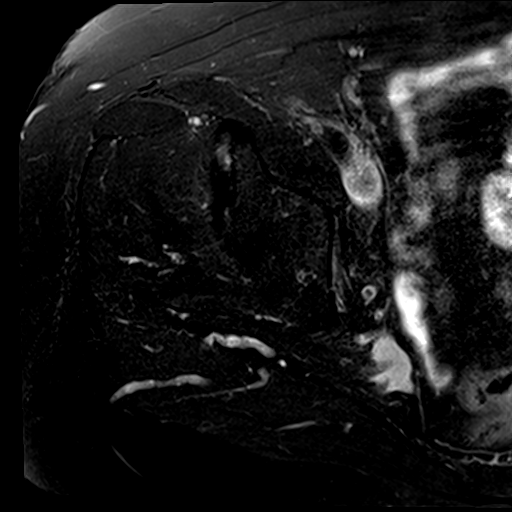
[im 30/30]
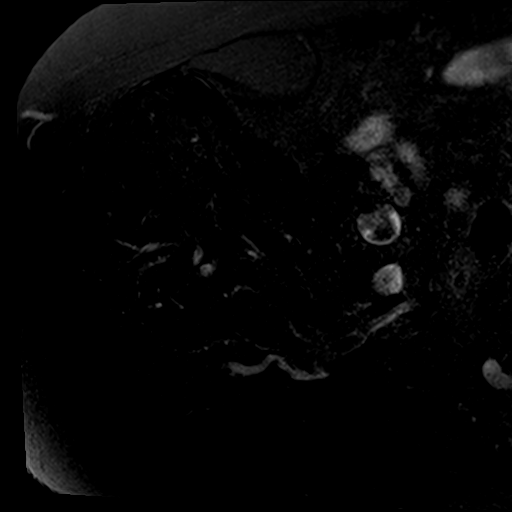

[Series 5: PD fat-sat · sagittal · right · 4.0mm · 0.70mm/px · 7 of 29 slices shown (1 of 2)]
[im 1/29]
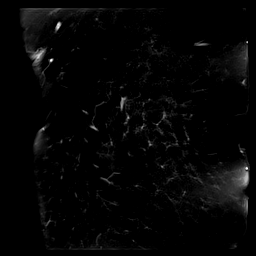
[im 5/29]
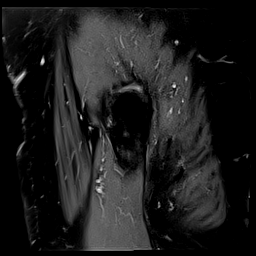
[im 10/29]
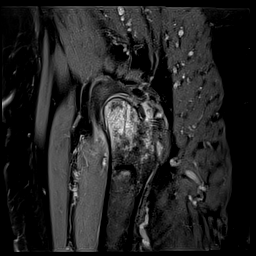
[im 15/29]
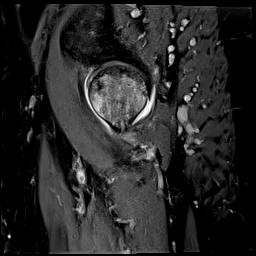
[im 19/29]
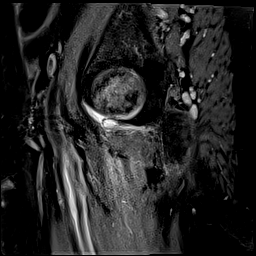
[im 24/29]
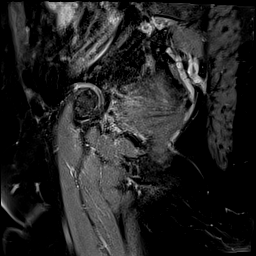
[im 29/29]
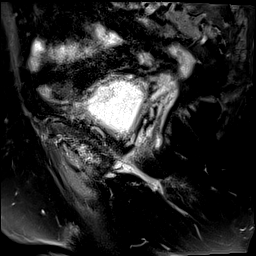

[Series 6: PD fat-sat · coronal · right · 4.0mm · 0.70mm/px · 7 of 29 slices shown (2 of 2)]
[im 1/29]
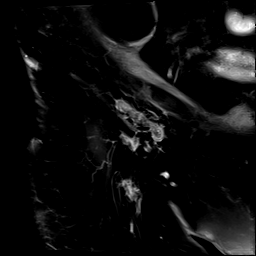
[im 5/29]
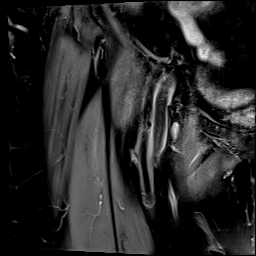
[im 10/29]
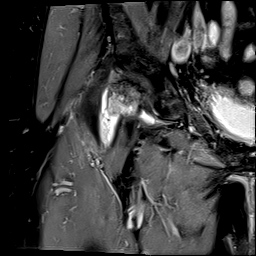
[im 15/29]
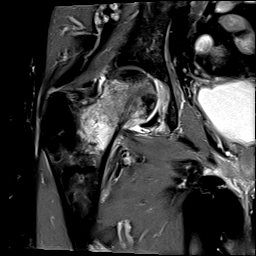
[im 19/29]
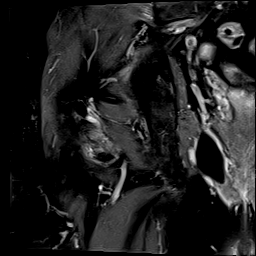
[im 24/29]
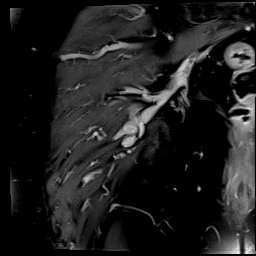
[im 29/29]
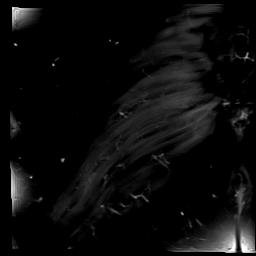

[25 of 40 positions shown; findings below may reference images not displayed]

FINDINGS: There is a large focus of AVN involving the right femoral head with
partial head collapse and flattening. There is extensive marrow
edema extending down into the intertrochanteric region of the hip.
There is also a joint effusion.

The left hip demonstrates a small focus of chronic AVN.

The pubic symphysis and SI joints are intact. Mild degenerative
changes but no pelvic fractures or bone lesions.

The surrounding hip and pelvic musculature are unremarkable. No
muscle tear, myositis or mass. No findings for peritendinitis or
trochanteric bursitis.

No significant intrapelvic abnormalities are identified. There is a
benign-appearing lipoma associated with the right upper pelvis.
IMPRESSION: 1. Large focus of AVN involving the right femoral head with
associated partial head collapse and flattening. Extensive
surrounding marrow edema and associated joint effusion.
2. Small remote focus of AVN involving the left hip.
3. Intact bony pelvis.
4. Benign-appearing 7 cm lipoma associated with the right lower
oblique abdominal muscles.

## 2020-03-14 ENCOUNTER — Observation Stay
Admission: EM | Admit: 2020-03-14 | Discharge: 2020-03-16 | Disposition: A | Discharge status: Home / Self Care | Payer: Medicare Other | Attending: Internal Medicine | Admitting: Internal Medicine

## 2020-03-14 ENCOUNTER — Emergency Department: Payer: Medicare Other

## 2020-03-14 ENCOUNTER — Other Ambulatory Visit: Payer: Self-pay

## 2020-03-14 ENCOUNTER — Encounter: Payer: Self-pay | Admitting: Internal Medicine

## 2020-03-14 DIAGNOSIS — S2243XA Multiple fractures of ribs, bilateral, initial encounter for closed fracture: Principal | ICD-10-CM | POA: Insufficient documentation

## 2020-03-14 DIAGNOSIS — Z96641 Presence of right artificial hip joint: Secondary | ICD-10-CM | POA: Insufficient documentation

## 2020-03-14 DIAGNOSIS — M1610 Unilateral primary osteoarthritis, unspecified hip: Secondary | ICD-10-CM

## 2020-03-14 DIAGNOSIS — S2242XA Multiple fractures of ribs, left side, initial encounter for closed fracture: Secondary | ICD-10-CM | POA: Diagnosis not present

## 2020-03-14 DIAGNOSIS — S2249XA Multiple fractures of ribs, unspecified side, initial encounter for closed fracture: Secondary | ICD-10-CM | POA: Diagnosis present

## 2020-03-14 DIAGNOSIS — I10 Essential (primary) hypertension: Secondary | ICD-10-CM

## 2020-03-14 DIAGNOSIS — Y939 Activity, unspecified: Secondary | ICD-10-CM | POA: Insufficient documentation

## 2020-03-14 DIAGNOSIS — Y929 Unspecified place or not applicable: Secondary | ICD-10-CM | POA: Insufficient documentation

## 2020-03-14 DIAGNOSIS — Z79899 Other long term (current) drug therapy: Secondary | ICD-10-CM | POA: Diagnosis not present

## 2020-03-14 DIAGNOSIS — R053 Chronic cough: Secondary | ICD-10-CM | POA: Insufficient documentation

## 2020-03-14 DIAGNOSIS — Z20822 Contact with and (suspected) exposure to covid-19: Secondary | ICD-10-CM | POA: Insufficient documentation

## 2020-03-14 DIAGNOSIS — T7840XS Allergy, unspecified, sequela: Secondary | ICD-10-CM | POA: Diagnosis not present

## 2020-03-14 DIAGNOSIS — Y999 Unspecified external cause status: Secondary | ICD-10-CM | POA: Insufficient documentation

## 2020-03-14 DIAGNOSIS — Z7982 Long term (current) use of aspirin: Secondary | ICD-10-CM | POA: Diagnosis not present

## 2020-03-14 DIAGNOSIS — T7840XA Allergy, unspecified, initial encounter: Secondary | ICD-10-CM | POA: Insufficient documentation

## 2020-03-14 DIAGNOSIS — W19XXXA Unspecified fall, initial encounter: Secondary | ICD-10-CM | POA: Diagnosis not present

## 2020-03-14 DIAGNOSIS — R05 Cough: Secondary | ICD-10-CM | POA: Diagnosis not present

## 2020-03-14 DIAGNOSIS — S299XXA Unspecified injury of thorax, initial encounter: Secondary | ICD-10-CM | POA: Diagnosis present

## 2020-03-14 DIAGNOSIS — M7501 Adhesive capsulitis of right shoulder: Secondary | ICD-10-CM

## 2020-03-14 DIAGNOSIS — F32A Depression, unspecified: Secondary | ICD-10-CM | POA: Insufficient documentation

## 2020-03-14 DIAGNOSIS — R9082 White matter disease, unspecified: Secondary | ICD-10-CM | POA: Insufficient documentation

## 2020-03-14 DIAGNOSIS — F329 Major depressive disorder, single episode, unspecified: Secondary | ICD-10-CM

## 2020-03-14 DIAGNOSIS — Z96649 Presence of unspecified artificial hip joint: Secondary | ICD-10-CM

## 2020-03-14 HISTORY — DX: Allergy, unspecified, initial encounter: T78.40XA

## 2020-03-14 HISTORY — DX: Irritable bowel syndrome without diarrhea: K58.9

## 2020-03-14 HISTORY — DX: Urinary tract infection, site not specified: N39.0

## 2020-03-14 LAB — SARS CORONAVIRUS 2 BY RT PCR (HOSPITAL ORDER, PERFORMED IN ~~LOC~~ HOSPITAL LAB): SARS Coronavirus 2: NEGATIVE

## 2020-03-14 LAB — BASIC METABOLIC PANEL
Anion gap: 8 (ref 5–15)
BUN: 14 mg/dL (ref 8–23)
CO2: 25 mmol/L (ref 22–32)
Calcium: 8.9 mg/dL (ref 8.9–10.3)
Chloride: 101 mmol/L (ref 98–111)
Creatinine, Ser: 0.9 mg/dL (ref 0.44–1.00)
GFR calc Af Amer: >60 mL/min (ref >60)
GFR calc non Af Amer: 57 mL/min — ABNORMAL LOW (ref >60)
Glucose, Bld: 106 mg/dL — ABNORMAL HIGH (ref 70–99)
Potassium: 3.6 mmol/L (ref 3.5–5.1)
Sodium: 134 mmol/L — ABNORMAL LOW (ref 135–145)

## 2020-03-14 LAB — CBC
HCT: 37.7 % (ref 36.0–46.0)
Hemoglobin: 12.3 g/dL (ref 12.0–15.0)
MCH: 27.2 pg (ref 26.0–34.0)
MCHC: 32.6 g/dL (ref 30.0–36.0)
MCV: 83.2 fL (ref 80.0–100.0)
Platelets: 246 10*3/uL (ref 150–400)
RBC: 4.53 MIL/uL (ref 3.87–5.11)
RDW: 15.6 % — ABNORMAL HIGH (ref 11.5–15.5)
WBC: 9.3 10*3/uL (ref 4.0–10.5)
nRBC: 0 % (ref 0.0–0.2)

## 2020-03-14 MED ORDER — CHOLESTYRAMINE 4 G PO PACK
4.0000 g | PACK | Freq: Every day | ORAL | Status: DC
  Administered 2020-03-15 – 2020-03-16 (×2): 4 g via ORAL
  Filled 2020-03-14 (×3): qty 1

## 2020-03-14 MED ORDER — ESCITALOPRAM OXALATE 10 MG PO TABS
20.0000 mg | ORAL_TABLET | Freq: Every day | ORAL | Status: DC
  Administered 2020-03-15 – 2020-03-16 (×2): 20 mg via ORAL
  Filled 2020-03-14 (×2): qty 2

## 2020-03-14 MED ORDER — ASPIRIN EC 81 MG PO TBEC
81.0000 mg | DELAYED_RELEASE_TABLET | Freq: Every day | ORAL | Status: DC
  Administered 2020-03-15 – 2020-03-16 (×2): 81 mg via ORAL
  Filled 2020-03-14 (×2): qty 1

## 2020-03-14 MED ORDER — OLOPATADINE HCL 0.1 % OP SOLN
1.0000 [drp] | Freq: Two times a day (BID) | OPHTHALMIC | Status: DC | PRN
  Filled 2020-03-14: qty 5

## 2020-03-14 MED ORDER — MELOXICAM 7.5 MG PO TABS
7.5000 mg | ORAL_TABLET | Freq: Every day | ORAL | Status: DC
  Administered 2020-03-15: 7.5 mg via ORAL
  Filled 2020-03-14 (×2): qty 1

## 2020-03-14 MED ORDER — PANTOPRAZOLE SODIUM 40 MG PO TBEC
40.0000 mg | DELAYED_RELEASE_TABLET | Freq: Two times a day (BID) | ORAL | Status: DC
  Administered 2020-03-15 – 2020-03-16 (×3): 40 mg via ORAL
  Filled 2020-03-14 (×4): qty 1

## 2020-03-14 MED ORDER — LORATADINE 10 MG PO TABS
10.0000 mg | ORAL_TABLET | Freq: Every day | ORAL | Status: DC
  Administered 2020-03-15 – 2020-03-16 (×2): 10 mg via ORAL
  Filled 2020-03-14 (×2): qty 1

## 2020-03-14 MED ORDER — LIDOCAINE 5 % EX PTCH
1.0000 | MEDICATED_PATCH | Freq: Every day | CUTANEOUS | Status: DC
  Administered 2020-03-15 – 2020-03-16 (×2): 1 via TRANSDERMAL
  Filled 2020-03-14 (×2): qty 1

## 2020-03-14 MED ORDER — IRBESARTAN 150 MG PO TABS
150.0000 mg | ORAL_TABLET | Freq: Every day | ORAL | Status: DC
  Administered 2020-03-15 – 2020-03-16 (×2): 150 mg via ORAL
  Filled 2020-03-14 (×3): qty 1

## 2020-03-14 MED ORDER — TRIAMTERENE-HCTZ 37.5-25 MG PO TABS
0.5000 | ORAL_TABLET | Freq: Every day | ORAL | Status: DC
  Administered 2020-03-16: 0.5 via ORAL
  Filled 2020-03-14 (×2): qty 0.5

## 2020-03-14 MED ORDER — ONDANSETRON HCL 4 MG/2ML IJ SOLN: 4.0000 mg | Freq: Four times a day (QID) | INTRAMUSCULAR | Status: DC | PRN

## 2020-03-14 MED ORDER — IPRATROPIUM BROMIDE 0.06 % NA SOLN
2.0000 | Freq: Two times a day (BID) | NASAL | Status: DC
  Administered 2020-03-15 – 2020-03-16 (×3): 2 via NASAL
  Filled 2020-03-14: qty 15

## 2020-03-14 MED ORDER — TRAMADOL HCL 50 MG PO TABS: 50.0000 mg | ORAL_TABLET | Freq: Three times a day (TID) | ORAL | Status: DC | PRN

## 2020-03-14 MED ORDER — ONDANSETRON HCL 4 MG PO TABS: 4.0000 mg | ORAL_TABLET | Freq: Four times a day (QID) | ORAL | Status: DC | PRN

## 2020-03-14 MED ORDER — DICLOFENAC SODIUM 1 % EX GEL
2.0000 g | Freq: Four times a day (QID) | CUTANEOUS | Status: DC
  Administered 2020-03-15 – 2020-03-16 (×3): 2 g via TOPICAL
  Filled 2020-03-14: qty 100

## 2020-03-14 MED ORDER — RISAQUAD PO CAPS
1.0000 | ORAL_CAPSULE | Freq: Every day | ORAL | Status: DC
  Administered 2020-03-15 – 2020-03-16 (×2): 1 via ORAL
  Filled 2020-03-14 (×2): qty 1

## 2020-03-14 MED ORDER — TRAMADOL HCL 50 MG PO TABS
50.0000 mg | ORAL_TABLET | Freq: Once | ORAL | Status: AC
  Administered 2020-03-14: 50 mg via ORAL
  Filled 2020-03-14: qty 1

## 2020-03-14 MED ORDER — ADULT MULTIVITAMIN W/MINERALS CH
1.0000 | ORAL_TABLET | Freq: Every day | ORAL | Status: DC
  Administered 2020-03-15 – 2020-03-16 (×2): 1 via ORAL
  Filled 2020-03-14 (×2): qty 1

## 2020-03-14 MED ORDER — VITAMIN D 25 MCG (1000 UNIT) PO TABS
1000.0000 [IU] | ORAL_TABLET | Freq: Every day | ORAL | Status: DC
  Administered 2020-03-15 – 2020-03-16 (×2): 1000 [IU] via ORAL
  Filled 2020-03-14 (×2): qty 1

## 2020-03-14 NOTE — ED Notes (Signed)
Patient ambulated with steady gait with use of her cane. Pulse ox stayed at 95-96 %  While ambulating.
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
## 2020-03-14 NOTE — ED Triage Notes (Signed)
Pt from [facility]. Pt was outside and lost balance while using cane. Pt hit head on metal drain in yard. Pt alert and oriented x4. Pt with large swollen area above left eye, small abrasions and cuts present. Pt with complaint of head pain and left knee pain and right thumb pain. Pt with bruising to top of left thumb, bruise to outside of left knee. Pt denies LOC and denies N/V.

## 2020-03-14 NOTE — ED Notes (Signed)
Pt given warm blanket. Pt walked to and from bathroom with cane and stand by assist, gait slow, gait steady.

## 2020-03-14 NOTE — ED Provider Notes (Signed)
[facility] Emergency Department Provider Note  ____________________________________________  Time seen: Approximately 5:25 PM  I have reviewed the triage vital signs and the nursing notes.   HISTORY  Chief Complaint Fall    HPI Molly Figueroa is a 86 y.o. female presents to the emergency department for evaluation after a fall today.  Patient was having a shopping day and was outside of [employer] when there was something on the sidewalk that she tried to kick out of her way but lost her balance and fell.  She landed on her left side.  She hit her face on the concrete.  She has pain to her left rib cage, left thumb and left knee.  She did not lose consciousness.  She takes a baby aspirin but no additional blood thinners.  Patient lives by herself.  No shortness of breath, vomiting, abdominal pain, hip pain.   Past Medical History:  Diagnosis Date  . Allergies   . Frequent UTI   . Hypertension   . IBS (irritable bowel syndrome)     Patient Active Problem List   Diagnosis Date Noted  . Rib fractures 03/14/2020  . Chronic cough   . Allergies   . Depression   . Closed fracture of body of sternum 05/22/2019  . Urethral caruncle 05/08/2019  . Urinary tract infectious disease 05/08/2019  . Transient ischemic attack 05/08/2019  . Radial styloid tenosynovitis 05/08/2019  . 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 Osteoporosis 05/08/2019  . Lumbar radiculopathy 05/08/2019  . Irritable bowel syndrome with diarrhea 05/08/2019  . Injury of kidney 05/08/2019  . Essential hypertension 05/08/2019  . Hyperlipidemia 05/08/2019  . Hyperchloremic acidosis 05/08/2019  . History of appendectomy 05/08/2019  . Hip pain 05/08/2019  . Hammer toe 05/08/2019  . Goiter 05/08/2019  . Generalized osteoarthritis 05/08/2019  . Ganglion of joint 05/08/2019  . Dry eye syndrome 05/08/2019  . Closed Colles' fracture 05/08/2019  . Cervical radiculopathy 05/08/2019  .  Bilateral carpal tunnel syndrome 05/08/2019  . Benign neoplastic disease 05/08/2019  . Arthritis of hip 05/08/2019  . Anemia 05/08/2019  . Adenomatous polyp of colon 05/08/2019  . History of total hip arthroplasty 03/04/2019  . Avascular necrosis of bone ([place]) 01/02/2019  . Pyelonephritis, acute 09/04/2018  . Recurrent colitis due to Clostridium difficile 06/18/2018  . Enthesopathy of left hip region 10/14/2017  . Strain of foot, left 06/19/2017  . Closed nondisplaced fracture of head of left radius with routine healing 05/30/2017  . Vitamin D insufficiency 06/21/2016  . Low vitamin B12 level 06/21/2016  . H/O allergic drug reaction 12/27/2014  . Nontoxic multinodular goiter 12/16/2014  . Major depressive disorder, recurrent episode, moderate ([place]) 06/03/2014  . Incomplete emptying of bladder 11/13/2013  . Cystocele 11/13/2013  . 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 Pseudophakia of both eyes 01/04/2013  . GERD (gastroesophageal reflux disease) 03/29/2012  . Tinnitus of both ears 03/13/2012  . Sensorineural hearing loss of both ears 03/13/2012  . BPPV (benign paroxysmal positional vertigo) 03/13/2012  . Subacromial bursitis 11/15/2011  . Adhesive capsulitis of right shoulder 11/15/2011  . Incontinence of sphincter ani 08/03/2011  . Allergic conjunctivitis of both eyes 07/26/2011  . Voiding dysfunction 07/04/2011  . Recurrent UTI 07/04/2011  . Fatigue 05/19/2011  . TMJPDS (temporomandibular joint pain dysfunction syndrome) 02/28/2011  . Increased frequency of urination 02/28/2011    Past Surgical History:  Procedure Laterality Date  . ABDOMINAL HYSTERECTOMY    . APPENDECTOMY    . BACK SURGERY    .  HIP ARTHROPLASTY    . JOINT REPLACEMENT  01/22/2019   R hip replacement  . TONSILLECTOMY      Prior to Admission medications   Medication Sig Start Date End Date Taking? Authorizing Provider  acetaminophen (TYLENOL) 500 MG tablet Take 500 mg by mouth every 6 (six) hours as needed for  mild pain or fever.   Yes [provider]  acidophilus (RISAQUAD) CAPS capsule Take 1 capsule by mouth daily.   Yes [provider]  aspirin EC 81 MG tablet Take 81 mg by mouth daily.    Yes [provider]  azelastine (ASTELIN) 0.1 % nasal spray Place 1 spray into both nostrils 2 (two) times daily. 01/28/20  Yes [provider]  cholecalciferol (VITAMIN D3) 25 MCG (1000 UT) tablet Take 1,000 Units by mouth daily.   Yes [provider]  cholestyramine (QUESTRAN) 4 g packet Take 4 g by mouth 2 (two) times daily.    Yes [provider]  diclofenac sodium (VOLTAREN) 1 % GEL Apply 2 g topically 4 (four) times daily. 05/17/19  Yes [provider]  escitalopram (LEXAPRO) 20 MG tablet Take 20 mg by mouth daily.  08/31/18  Yes [provider]  lidocaine (LIDODERM) 5 % Place 1 patch onto the skin daily. Remove after 12 hours and replace following day 08/31/18  Yes [provider]  meloxicam (MOBIC) 7.5 MG tablet Take 7.5 mg by mouth daily.    Yes [provider]  Multiple Vitamin (MULTIVITAMIN WITH MINERALS) TABS tablet Take 1 tablet by mouth daily.   Yes [provider]  Olopatadine HCl (PATADAY) 0.2 % SOLN Place 1 drop into both eyes daily as needed (eye irritations).   Yes [provider]  omeprazole (PRILOSEC) 20 MG capsule Take 20 mg by mouth 2 (two) times daily before a meal.    Yes [provider]  solifenacin (VESICARE) 5 MG tablet Take 5 mg by mouth daily.   Yes [provider]  telmisartan (MICARDIS) 20 MG tablet Take 40 mg by mouth daily.    Yes [provider]  triamterene-hydrochlorothiazide (MAXZIDE-25) 37.5-25 MG tablet Take 0.5 tablets by mouth daily.    Yes [provider]  cetirizine (ZYRTEC) 10 MG tablet Take 10 mg by mouth daily. Patient not taking: Reported on 03/14/2020    [provider]  Olopatadine HCl 0.2 % SOLN Place 1 drop into both eyes  daily as needed (eye irritation).  Patient not taking: Reported on 03/14/2020    [provider]  traMADol (ULTRAM) 50 MG tablet Take 1 tablet (50 mg total) by mouth every 6 (six) hours as needed for moderate pain or severe pain. Patient not taking: Reported on 03/14/2020 05/23/19   Henreitta Leber, MD    Allergies Amlodipine, Codeine, Gabapentin, Gentak [gentamicin sulfate], Phenothiazines, Tobradex [tobramycin-dexamethasone], Wellbutrin [bupropion], Cefuroxime axetil, Macrolides and ketolides, and Sulfa antibiotics  Family History  Problem Relation Age of Onset  . CAD Mother   . Bladder Cancer Neg Hx   . Kidney cancer Neg Hx     Social History Social History   Tobacco Use  . Smoking status: Never Smoker  . Smokeless tobacco: Never Used  Substance Use Topics  . Alcohol use: Not Currently  . Drug use: Never     Review of Systems  Constitutional: No fever/chills ENT: No upper respiratory complaints. Cardiovascular: Positive for chest wall pain. Respiratory: No cough. No SOB. Gastrointestinal: No abdominal pain.  No nausea, no vomiting.  Musculoskeletal:  Positive for thumb and knee pain. Skin: Negative for rash, abrasions, lacerations. Positive for ecchymosis. Neurological: Negative for headaches, numbness or tingling   ____________________________________________   PHYSICAL EXAM:  VITAL SIGNS: ED Triage Vitals  Enc Vitals Group     BP 03/14/20 1615 (!) 141/71     Pulse Rate 03/14/20 1615 69     Resp 03/14/20 1615 18     Temp 03/14/20 1615 98.2 F (36.8 C)     Temp Source 03/14/20 1615 Oral     SpO2 03/14/20 1615 94 %     Weight 03/14/20 1617 147 lb (66.7 kg)     Height 03/14/20 1617 5\' 2"  (1.575 m)     Head Circumference --      Peak Flow --      Pain Score 03/14/20 1616 9     Pain Loc --      Pain Edu? --      Excl. in [place]? --      Constitutional: Alert and oriented. Well appearing and in no acute distress. Eyes: Conjunctivae are normal. PERRL.  EOMI. Head: Atraumatic. ENT:      Ears:      Nose: No congestion/rhinnorhea.      Mouth/Throat: Mucous membranes are moist.  Neck: No stridor. No cervical spine tenderness to palpation. Cardiovascular: Normal rate, regular rhythm.  Good peripheral circulation.  Tenderness to palpation throughout anterior chest wall with increased tenderness to palpation to inferior left anterior rib cage. Respiratory: Normal respiratory effort without tachypnea or retractions. Lungs CTAB. Good air entry to the bases with no decreased or absent breath sounds. Gastrointestinal: Bowel sounds 4 quadrants. Soft and nontender to palpation. No guarding or rigidity. No palpable masses. No distention. Musculoskeletal: Full range of motion to all extremities. No gross deformities appreciated.  Weightbearing.  Ambulatory with assistance and cane. Neurologic:  Normal speech and language. No gross focal neurologic deficits are appreciated.  Skin:  Skin is warm, dry and intact. No rash noted. Psychiatric: Mood and affect are normal. Speech and behavior are normal. Patient exhibits appropriate insight and judgement.   ____________________________________________   LABS (all labs ordered are listed, but only abnormal results are displayed)  Labs Reviewed  CBC - Abnormal; Notable for the following components:      Result Value   RDW 15.6 (*)    All other components within normal limits  BASIC METABOLIC PANEL - Abnormal; Notable for the following components:   Sodium 134 (*)    Glucose, Bld 106 (*)    GFR calc non Af Amer 57 (*)    All other components within normal limits  SARS CORONAVIRUS 2 BY RT PCR (HOSPITAL ORDER, [place] [facility])  PROTEIN ELECTROPHORESIS, SERUM  BASIC METABOLIC PANEL  CBC  URINALYSIS, COMPLETE (UACMP) WITH MICROSCOPIC   ____________________________________________  EKG   ____________________________________________  RADIOLOGY Robinette Haines, personally viewed  and evaluated these images (plain radiographs) as part of my medical decision making, as well as reviewing the written report by the radiologist.  CT Head Wo Contrast  Result Date: 03/14/2020 CLINICAL DATA:  Posttraumatic headache after fall. EXAM: CT HEAD WITHOUT CONTRAST CT MAXILLOFACIAL WITHOUT CONTRAST CT CERVICAL SPINE WITHOUT CONTRAST TECHNIQUE: Multidetector CT imaging of the head, cervical spine, and maxillofacial structures were performed using the standard protocol without intravenous contrast. Multiplanar CT image reconstructions of the cervical spine and maxillofacial structures were also generated. COMPARISON:  None. FINDINGS: CT HEAD FINDINGS Brain: Mild chronic ischemic white matter disease is noted.  No mass effect or midline shift is noted. Ventricular size is within normal limits. There is no evidence of mass lesion, hemorrhage or acute infarction. Vascular: No hyperdense vessel or unexpected calcification. Skull: Normal. Negative for fracture or focal lesion. Other: None. CT MAXILLOFACIAL FINDINGS Osseous: No fracture or mandibular dislocation. No destructive process. Orbits: Negative. No traumatic or inflammatory finding. Sinuses: Clear. Soft tissues: Negative. CT CERVICAL SPINE FINDINGS Alignment: Minimal grade 1 anterolisthesis of C4-5 is noted secondary to posterior facet joint hypertrophy. Skull base and vertebrae: No acute fracture. No primary bone lesion or focal pathologic process. Soft tissues and spinal canal: No prevertebral fluid or swelling. No visible canal hematoma. Disc levels: Moderate degenerative disc disease is noted at C5-6 and C6-7 with anterior posterior osteophyte formation. Upper chest: Negative. Other: None. IMPRESSION: 1. Mild chronic ischemic white matter disease. No acute intracranial abnormality seen. 2. No abnormality seen in maxillofacial region. 3. Multilevel degenerative disc disease. No acute abnormality seen in the cervical spine. Electronically Signed   By:  Marijo Conception M.D.   On: 03/14/2020 17:26   CT Chest Wo Contrast  Addendum Date: 03/14/2020   ADDENDUM REPORT: 03/14/2020 18:36 ADDENDUM: Upon further review with the ordering physician who noted sites of focal tenderness along the chest wall additional more acute appearing nondisplaced left 8th and right 9th lateral rib fractures are noted best seen on sagittal reconstructions (7/142, 14). Addendum communicated on 03/14/2020 at 6:35 pm to provider [facility] , who verbally acknowledged these results. Electronically Signed   By: Lovena Le M.D.   On: 03/14/2020 18:36   Result Date: 03/14/2020 CLINICAL DATA:  Fall EXAM: CT CHEST WITHOUT CONTRAST TECHNIQUE: Multidetector CT imaging of the chest was performed following the standard protocol without IV contrast. COMPARISON:  CT angiography 05/22/2019 FINDINGS: Cardiovascular: Atherosclerotic plaque within the normal caliber aorta. No abnormal plaque displacement or hyperdense mural thickening to suggest intramural hematoma. Luminal evaluation precluded in the absence of contrast media may limit detection of subtle acute aortic abnormalities. No periaortic stranding or hemorrhage. Shared origin of the brachiocephalic and left common carotid arteries. Minimal plaque in the proximal great vessels. Mild upper abdominal atherosclerosis noted as well. Normal heart size. No pericardial effusion. Few coronary artery calcifications are noted predominantly in the LAD. Central pulmonary arteries are normal caliber. Tortuous and serpiginous vessels seen coursing towards the lingula have an appearance most compatible with a pulmonary AVM, incompletely assessed on noncontrast CT examination though unchanged from priors and of doubtful acute clinical significance. Central pulmonary arteries are normal caliber. Mediastinum/Nodes: No mediastinal fluid or gas. Normal thyroid gland and thoracic inlet. No acute abnormality of the trachea or esophagus. No worrisome mediastinal  or axillary adenopathy. Hilar nodal evaluation is limited in the absence of intravenous contrast media. Lungs/Pleura: No acute traumatic findings of the lung parenchyma. Tortuous, serpentine vessels coursing towards the lingula compatible with an AVM, as detailed above with some mild surrounding hyperemia. This is of doubtful acute clinical significance. Dependent atelectasis posteriorly. No consolidation, features of edema, pneumothorax, or effusion. No suspicious pulmonary nodules or masses. Upper Abdomen: Areas of cortical scarring in the kidneys. Nonspecific mild bilateral symmetric perinephric stranding is similar to prior. Upper abdominal atherosclerosis. No acute abnormalities present in the visualized portions of the upper abdomen. Musculoskeletal: Subacute appearing sternal fracture with callus formation. Additional subacute appearing fractures of the right second through seventh ribs anterolaterally and left fourth through sixth ribs laterally. No acute osseous abnormalities are clearly evident. Multilevel degenerative changes are present  in the imaged portions of the spine. Additional degenerative changes in the shoulders. IMPRESSION: 1. No evidence of acute traumatic injury to the chest. 2. Subacute appearing sternal fracture with callus formation. Additional subacute appearing fractures of the right second through seventh ribs anterolaterally and left fourth through sixth ribs laterally. 3. Tortuous and serpentine vessels coursing towards the lingula have an appearance most compatible with a pulmonary AVM, incompletely assessed on noncontrast CT examination though unchanged from priors and of doubtful acute clinical significance. 4. Aortic Atherosclerosis (ICD10-I70.0). Electronically Signed: By: Lovena Le M.D. On: 03/14/2020 17:34   CT Cervical Spine Wo Contrast  Result Date: 03/14/2020 CLINICAL DATA:  Head trauma, lost balance EXAM: CT CERVICAL SPINE WITHOUT CONTRAST TECHNIQUE: Multidetector CT  imaging of the cervical spine was performed without intravenous contrast. Multiplanar CT image reconstructions were also generated. COMPARISON:  None. FINDINGS: Alignment: There is a minimal anterolisthesis of C4 on C5. There is straightening of the normal cervical lordosis. Skull base and vertebrae: Visualized skull base is intact. No atlanto-occipital dissociation. The vertebral body heights are well maintained. No fracture or pathologic osseous lesion seen. Soft tissues and spinal canal: The visualized paraspinal soft tissues are unremarkable. No prevertebral soft tissue swelling is seen. The spinal canal is grossly unremarkable, no large epidural collection or significant canal narrowing. Disc levels: Cervical spine spondylosis is seen with anterior osteophytes, disc osteophyte complex and uncovertebral osteophytes most notable at C5-C6 with moderate neural foraminal narrowing and mild central canal stenosis. Upper chest: Biapical scarring is noted. Thoracic inlet is within normal limits. Other: None IMPRESSION: No acute fracture or malalignment. Chronic grade 1 minimal anterolisthesis of C4 on C5. Electronically Signed   By: Prudencio Pair M.D.   On: 03/14/2020 17:25   DG Knee Complete 4 Views Left  Result Date: 03/14/2020 CLINICAL DATA:  Left knee pain following a fall today. EXAM: LEFT KNEE - COMPLETE 4+ VIEW COMPARISON:  05/22/2019 FINDINGS: Moderate medial joint space narrowing with mild progression. Mild medial and lateral meniscal calcifications. No fracture, dislocation or effusion. IMPRESSION: 1. No fracture. 2. Chondrocalcinosis. Electronically Signed   By: Claudie Revering M.D.   On: 03/14/2020 17:35   DG Hand Complete Left  Result Date: 03/14/2020 CLINICAL DATA:  Left thumb pain following a fall today. EXAM: LEFT HAND - COMPLETE 3+ VIEW COMPARISON:  05/22/2019 FINDINGS: Diffuse osteopenia. No fracture or dislocation seen. Mild degenerative spur formation involving multiple IP joints and 1st MCP  joint. IMPRESSION: No fracture.  Mild multi joint degenerative changes. Electronically Signed   By: Claudie Revering M.D.   On: 03/14/2020 17:34   CT Maxillofacial Wo Contrast  Result Date: 03/14/2020 CLINICAL DATA:  Posttraumatic headache after fall. EXAM: CT HEAD WITHOUT CONTRAST CT MAXILLOFACIAL WITHOUT CONTRAST CT CERVICAL SPINE WITHOUT CONTRAST TECHNIQUE: Multidetector CT imaging of the head, cervical spine, and maxillofacial structures were performed using the standard protocol without intravenous contrast. Multiplanar CT image reconstructions of the cervical spine and maxillofacial structures were also generated. COMPARISON:  None. FINDINGS: CT HEAD FINDINGS Brain: Mild chronic ischemic white matter disease is noted. No mass effect or midline shift is noted. Ventricular size is within normal limits. There is no evidence of mass lesion, hemorrhage or acute infarction. Vascular: No hyperdense vessel or unexpected calcification. Skull: Normal. Negative for fracture or focal lesion. Other: None. CT MAXILLOFACIAL FINDINGS Osseous: No fracture or mandibular dislocation. No destructive process. Orbits: Negative. No traumatic or inflammatory finding. Sinuses: Clear. Soft tissues: Negative. CT CERVICAL SPINE FINDINGS Alignment: Minimal grade 1  anterolisthesis of C4-5 is noted secondary to posterior facet joint hypertrophy. Skull base and vertebrae: No acute fracture. No primary bone lesion or focal pathologic process. Soft tissues and spinal canal: No prevertebral fluid or swelling. No visible canal hematoma. Disc levels: Moderate degenerative disc disease is noted at C5-6 and C6-7 with anterior posterior osteophyte formation. Upper chest: Negative. Other: None. IMPRESSION: 1. Mild chronic ischemic white matter disease. No acute intracranial abnormality seen. 2. No abnormality seen in maxillofacial region. 3. Multilevel degenerative disc disease. No acute abnormality seen in the cervical spine. Electronically Signed    By: Marijo Conception M.D.   On: 03/14/2020 17:26    ____________________________________________    PROCEDURES  Procedure(s) performed:    Procedures     ____________________________________________   INITIAL IMPRESSION / ASSESSMENT AND PLAN / ED COURSE  Pertinent labs & imaging results that were available during my care of the patient were reviewed by me and considered in my medical decision making (see chart for details).  Review of the [place] CSRS was performed in accordance of the [place] prior to dispensing any controlled drugs.   Patient presented to the emergency department for evaluation after fall.  Head CT, cervical CT, maxillofacial CT are negative for acute abnormalities.  No acute bony abnormality on knee x-ray or hand x-ray.  There are multiple subacute rib fractures on chest CT.  Radiologist Lovena Le was called regarding subacute rib fractures and after reevaluation, he reads two acute nondisplaced rib fractures to right ninth rib and left eighth rib but does not feel that any other rib fractures occurred today.  Patient is requiring assistance with ambulation and pain is not controlled fully with tramadol so will be admitted for pain control.  Dr. Bobbye Charleston is agreeable with admission.    Molly Figueroa was evaluated in Emergency Department on 03/14/2020 for the symptoms described in the history of present illness. She was evaluated in the context of the global COVID-19 pandemic, which necessitated consideration that the patient might be at risk for infection with the SARS-CoV-2 virus that causes COVID-19. Institutional protocols and algorithms that pertain to the evaluation of patients at risk for COVID-19 are in a state of rapid change based on information released by regulatory bodies including the CDC and federal and state organizations. These policies and algorithms were followed during the patient's care in the  ED.     ____________________________________________  FINAL CLINICAL IMPRESSION(S) / ED DIAGNOSES  Final diagnoses:  Fall, initial encounter  Closed fracture of multiple ribs, unspecified laterality, initial encounter      NEW MEDICATIONS STARTED DURING THIS VISIT:  ED Discharge Orders    None          This chart was dictated using voice recognition software/Dragon. Despite best efforts to proofread, errors can occur which can change the meaning. Any change was purely unintentional.    Laban Emperor, PA-C 03/14/20 2248    Duffy Bruce, MD 03/15/20 [phone]

## 2020-03-15 ENCOUNTER — Observation Stay: Payer: Medicare Other

## 2020-03-15 DIAGNOSIS — W19XXXA Unspecified fall, initial encounter: Secondary | ICD-10-CM | POA: Diagnosis not present

## 2020-03-15 DIAGNOSIS — S2249XA Multiple fractures of ribs, unspecified side, initial encounter for closed fracture: Secondary | ICD-10-CM

## 2020-03-15 DIAGNOSIS — S2243XA Multiple fractures of ribs, bilateral, initial encounter for closed fracture: Secondary | ICD-10-CM | POA: Diagnosis not present

## 2020-03-15 LAB — CBC
HCT: 37.9 % (ref 36.0–46.0)
Hemoglobin: 12.3 g/dL (ref 12.0–15.0)
MCH: 27.3 pg (ref 26.0–34.0)
MCHC: 32.5 g/dL (ref 30.0–36.0)
MCV: 84 fL (ref 80.0–100.0)
Platelets: 239 10*3/uL (ref 150–400)
RBC: 4.51 MIL/uL (ref 3.87–5.11)
RDW: 15.7 % — ABNORMAL HIGH (ref 11.5–15.5)
WBC: 6.4 10*3/uL (ref 4.0–10.5)
nRBC: 0 % (ref 0.0–0.2)

## 2020-03-15 LAB — BASIC METABOLIC PANEL
Anion gap: 6 (ref 5–15)
BUN: 13 mg/dL (ref 8–23)
CO2: 27 mmol/L (ref 22–32)
Calcium: 8.8 mg/dL — ABNORMAL LOW (ref 8.9–10.3)
Chloride: 101 mmol/L (ref 98–111)
Creatinine, Ser: 0.84 mg/dL (ref 0.44–1.00)
GFR calc Af Amer: >60 mL/min (ref >60)
GFR calc non Af Amer: >60 mL/min (ref >60)
Glucose, Bld: 101 mg/dL — ABNORMAL HIGH (ref 70–99)
Potassium: 3.7 mmol/L (ref 3.5–5.1)
Sodium: 134 mmol/L — ABNORMAL LOW (ref 135–145)

## 2020-03-15 LAB — URINALYSIS, COMPLETE (UACMP) WITH MICROSCOPIC
Bacteria, UA: NONE SEEN
Bilirubin Urine: NEGATIVE
Glucose, UA: NEGATIVE mg/dL
Hgb urine dipstick: NEGATIVE
Ketones, ur: NEGATIVE mg/dL
Leukocytes,Ua: NEGATIVE
Nitrite: NEGATIVE
Protein, ur: NEGATIVE mg/dL
Specific Gravity, Urine: 1.009 (ref 1.005–1.030)
pH: 6 (ref 5.0–8.0)

## 2020-03-15 MED ORDER — LIDOCAINE 5 % EX PTCH: 1.0000 | MEDICATED_PATCH | Freq: Every day | CUTANEOUS | 0 refills | Status: AC

## 2020-03-15 MED ORDER — TRAMADOL HCL 50 MG PO TABS: 50.0000 mg | ORAL_TABLET | Freq: Three times a day (TID) | ORAL | 0 refills | Status: AC | PRN

## 2020-03-15 NOTE — TOC Progression Note (Signed)
Transition of Care [facility]) - Progression Note    Patient Details  Name: Molly Figueroa MRN: [id number] Date of Birth: [birth year]  [facility] CM/SW Contact  Boris Sharper, LCSW Phone Number: 03/15/2020, 4:03 PM  Clinical Narrative:    Pt was set to discharge but Pt family want SNF. PT will reevaluate.        Expected Discharge Plan and Services           Expected Discharge Date: 03/15/20                                     Social Determinants of Health (SDOH) Interventions    Readmission Risk Interventions No flowsheet data found.
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
## 2020-03-15 NOTE — Progress Notes (Signed)
Pt remaining alert and oriented, no complaints of pain. Pt was able to sleep through the night.
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
## 2020-03-15 NOTE — Care Management Obs Status (Signed)
[place] NOTIFICATION   Patient Details  Name: Molly Figueroa MRN: [id number] Date of Birth: [birth year]   Medicare Observation Status Notification Given:  Other (see comment) (would not print)    Boris Sharper, LCSW 03/15/2020, 5:08 PM

## 2020-03-16 DIAGNOSIS — S2243XA Multiple fractures of ribs, bilateral, initial encounter for closed fracture: Secondary | ICD-10-CM | POA: Diagnosis not present

## 2020-03-16 NOTE — TOC Transition Note (Addendum)
Transition of Care [facility]) - CM/SW Discharge Note   Patient Details  Name: Molly Figueroa MRN: [id number] Date of Birth: [birth year]  [facility] CM/SW Contact:  Elease Hashimoto, LCSW Phone Number: 03/16/2020, 12:08 PM   Clinical Narrative:   Met with pt and daughter regarding discharge plan. Daughter made aware she can not go to SNF due to observation status and if they want to pursue she would be private pay. She does not have the money to do this. Pt is ok with going home. Has a rw and all needed equipment. Have made referral to [facility] for HHPT and OT. Daughter feels she needs a higher level of care but is a retired Therapist, sports herself and worked in [employer] and understand the constraints. MD aware of plan and will have PT see again today prior to discharging her.  4:00 pm Daughter to transport pt back home.     Barriers to Discharge: Family Issues   Patient Goals and CMS Choice Patient states their goals for this hospitalization and ongoing recovery are:: I will do whatever I would rather go home CMS Medicare.gov Compare Post Acute Care list provided to:: Patient Choice offered to / list presented to : Patient  Discharge Placement                       Discharge Plan and Services In-house Referral: Clinical Social Work   Post Acute Care Choice: Home Health                    HH Arranged: PT, OT [facility] Agency: [place] ([facility]) Date [place]: 03/16/20 Time [place]: 1206 Representative spoke with at [place]: [place] ([place]) Interventions     Readmission Risk Interventions No flowsheet data found.
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
## 2020-03-18 LAB — PROTEIN ELECTROPHORESIS, SERUM
A/G Ratio: 1.6 (ref 0.7–1.7)
Albumin ELP: 3.4 g/dL (ref 2.9–4.4)
Alpha-1-Globulin: 0.1 g/dL (ref 0.0–0.4)
Alpha-2-Globulin: 0.7 g/dL (ref 0.4–1.0)
Beta Globulin: 0.8 g/dL (ref 0.7–1.3)
Gamma Globulin: 0.5 g/dL (ref 0.4–1.8)
Globulin, Total: 2.1 g/dL — ABNORMAL LOW (ref 2.2–3.9)
Total Protein ELP: 5.5 g/dL — ABNORMAL LOW (ref 6.0–8.5)

## 2020-03-20 ENCOUNTER — Encounter: Payer: Self-pay | Admitting: Urology

## 2020-03-20 ENCOUNTER — Ambulatory Visit (INDEPENDENT_AMBULATORY_CARE_PROVIDER_SITE_OTHER): Payer: Medicare Other | Admitting: Urology

## 2020-03-20 ENCOUNTER — Other Ambulatory Visit: Payer: Self-pay

## 2020-03-20 VITALS — BP 113/72 | HR 80 | Ht 61.0 in | Wt 148.0 lb

## 2020-03-20 DIAGNOSIS — R339 Retention of urine, unspecified: Secondary | ICD-10-CM | POA: Diagnosis not present

## 2020-03-20 DIAGNOSIS — R8271 Bacteriuria: Secondary | ICD-10-CM | POA: Diagnosis not present

## 2020-03-20 DIAGNOSIS — N3941 Urge incontinence: Secondary | ICD-10-CM

## 2020-03-20 DIAGNOSIS — R350 Frequency of micturition: Secondary | ICD-10-CM | POA: Diagnosis not present

## 2020-03-20 LAB — URINALYSIS, COMPLETE
Bilirubin, UA: NEGATIVE
Glucose, UA: NEGATIVE
Ketones, UA: NEGATIVE
Leukocytes,UA: NEGATIVE
Nitrite, UA: NEGATIVE
Protein,UA: NEGATIVE
RBC, UA: NEGATIVE
Specific Gravity, UA: <1.005 — ABNORMAL LOW (ref 1.005–1.030)
Urobilinogen, Ur: 0.2 mg/dL (ref 0.2–1.0)
pH, UA: 5 (ref 5.0–7.5)

## 2020-03-20 LAB — MICROSCOPIC EXAMINATION
Bacteria, UA: NONE SEEN
Epithelial Cells (non renal): NONE SEEN /hpf (ref 0–10)

## 2020-03-20 MED ORDER — SOLIFENACIN SUCCINATE 5 MG PO TABS: 5.0000 mg | ORAL_TABLET | Freq: Every day | ORAL | 3 refills | Status: DC

## 2020-03-20 NOTE — Progress Notes (Signed)
03/20/2020 10:00 AM   Molly Figueroa Molly Figueroa 1931-06-22 [id number]  Referring provider: Donnamarie Rossetti, PA-C [place],  [place] [id number]  Chief Complaint  Patient presents with  . Cystitis    HPI:  F/u -   1) recurrent UTI -  She is on vaginal estrogen since Nov 2019. She's been having "UTI" for years. She gets dysuria and abx help. She typically voids with a good stream and no straining, but doesn't empty. She has seen the downside to abx with recent diarrhea and C. Diff. [facility]. Prior cystoscopies (2012, 2014, 2016) and CT workups (2019) have been unrevealing.CT A/P 04/2018 at [facility] for possible GI bleed report mentions no hydronephrosis or stones. 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 No gross hematuria. She drinksmainlywater. She has IBS, diarrhea.  2) LUTS, inc emptying - Inthe past her post void residuals have been around 200 mL. In order to help empty,[facility] tried clean intermittent catheterization (patient thought this increased her infections due to difficulty catheterizing her self cleanly), flomax (did not tolerate side effects), tibial nerve stimulation (no improvement in emptying). Shealsohas some urgency and UUI.ShestartedPT/OT at [place] hip.NG risk includes CVA. Prior hysterectomy.Mobility issues/walker use. She started solifenacin 5 mg in 2020 and symptoms improved.   Today, she returns and had a left hip replaced and is working with PT again. She recently completed Cipro for a 07/21 UA with > 50 wbc, few bacteria, no rbc, +LE, -N. No cx. She had no dysuria or bladder pain. UA clear today. She needs a refill of solifenacin.  PMH: Past Medical History:  Diagnosis Date  . Allergies   . Frequent UTI   . Hypertension   . IBS (irritable bowel syndrome)     Surgical History: Past Surgical History:  Procedure Laterality Date  . ABDOMINAL HYSTERECTOMY    . APPENDECTOMY    .  BACK SURGERY    . HIP ARTHROPLASTY    . JOINT REPLACEMENT  01/22/2019   R hip replacement  . TONSILLECTOMY      Home Medications:  Allergies as of 03/20/2020      Reactions   Amlodipine Swelling   Codeine Nausea And Vomiting   Gabapentin Itching   Gentak [gentamicin Sulfate] Itching   Phenothiazines Nausea And Vomiting   Tobradex [tobramycin-dexamethasone] Itching   Wellbutrin [bupropion] Swelling   Throat pain   Cefuroxime Axetil Rash   Macrolides And Ketolides Rash   Sulfa Antibiotics Rash      Medication List       Accurate as of March 20, 2020 10:00 AM. If you have any questions, ask your nurse or doctor.        acetaminophen 500 MG tablet Commonly known as: TYLENOL Take 500 mg by mouth every 6 (six) hours as needed for mild pain or fever.   acidophilus Caps capsule Take 1 capsule by mouth daily.   aspirin EC 81 MG tablet Take 81 mg by mouth daily.   azelastine 0.1 % nasal spray Commonly known as: ASTELIN Place 1 spray into both nostrils 2 (two) times daily.   cetirizine 10 MG tablet Commonly known as: ZYRTEC Take 10 mg by mouth daily.   cholecalciferol 25 MCG (1000 UNIT) tablet Commonly known as: VITAMIN D3 Take 1,000 Units by mouth daily.   cholestyramine 4 g packet Commonly known as: QUESTRAN Take 4 g by mouth 2 (two) times daily.   diclofenac sodium  1 % Gel Commonly known as: VOLTAREN Apply 2 g topically 4 (four) times daily.   escitalopram 20 MG tablet Commonly known as: LEXAPRO Take 20 mg by mouth daily.   lidocaine 5 % Commonly known as: LIDODERM Place 1 patch onto the skin daily. Remove & Discard patch within 12 hours or as directed by MD   meloxicam 7.5 MG tablet Commonly known as: MOBIC Take 7.5 mg by mouth daily.   multivitamin with minerals Tabs tablet Take 1 tablet by mouth daily.   omeprazole 20 MG capsule Commonly known as: PRILOSEC Take 20 mg by mouth 2 (two) times daily before a meal.   Pataday 0.2 % Soln Generic drug:  Olopatadine HCl Place 1 drop into both eyes daily as needed (eye irritations).   solifenacin 5 MG tablet Commonly known as: VESICARE Take 5 mg by mouth daily.   telmisartan 20 MG tablet Commonly known as: MICARDIS Take 40 mg by mouth daily.   traMADol 50 MG tablet Commonly known as: ULTRAM Take 1 tablet (50 mg total) by mouth every 8 (eight) hours as needed for moderate pain or severe pain.   triamterene-hydrochlorothiazide 37.5-25 MG tablet Commonly known as: MAXZIDE-25 Take 0.5 tablets by mouth daily.       Allergies:  Allergies  Allergen Reactions  . Amlodipine Swelling  . Codeine Nausea And Vomiting  . Gabapentin Itching  . Gentak [Gentamicin Sulfate] Itching  . Phenothiazines Nausea And Vomiting  . Tobradex [Tobramycin-Dexamethasone] Itching  . Wellbutrin [Bupropion] Swelling    Throat pain   . Cefuroxime Axetil Rash  . Macrolides And Ketolides Rash  . Sulfa Antibiotics Rash    Family History: Family History  Problem Relation Age of Onset  . CAD Mother   . Bladder Cancer Neg Hx   . Kidney cancer Neg Hx     Social History:  reports that she has never smoked. She has never used smokeless tobacco. She reports previous alcohol use. She reports that she does not use drugs.   Physical Exam: There were no vitals taken for this visit.  Constitutional:  Alert and oriented, No acute distress. HEENT: [place] AT, moist mucus membranes.  Trachea midline, no masses. Cardiovascular: No clubbing, cyanosis, or edema. Respiratory: Normal respiratory effort, no increased work of breathing. GI: Abdomen is soft, nontender, nondistended, no abdominal masses GU: No CVA tenderness Lymph: No cervical or inguinal lymphadenopathy. Skin: No rashes, bruises or suspicious lesions. Neurologic: Grossly intact, no focal deficits, moving all 4 extremities. Psychiatric: Normal mood and affect.  Laboratory Data: Lab Results  Component Value Date   WBC 6.4 03/15/2020   HGB 12.3  03/15/2020   HCT 37.9 03/15/2020   MCV 84.0 03/15/2020   PLT 239 03/15/2020    Lab Results  Component Value Date   CREATININE 0.84 03/15/2020    No results found for: PSA  No results found for: TESTOSTERONE  No results found for: HGBA1C  Urinalysis    Component Value Date/Time   COLORURINE YELLOW (A) 03/15/2020 1315   APPEARANCEUR CLEAR (A) 03/15/2020 1315   APPEARANCEUR Clear 02/01/2019 1428   LABSPEC 1.009 03/15/2020 1315   PHURINE 6.0 03/15/2020 [place] 03/15/2020 1315   [place] 03/15/2020 [place] 03/15/2020 1315   BILIRUBINUR Negative 02/01/2019 [place] 03/15/2020 1315   PROTEINUR NEGATIVE 03/15/2020 1315   NITRITE NEGATIVE 03/15/2020 1315   LEUKOCYTESUR NEGATIVE 03/15/2020 1315    Lab Results  Component Value Date   LABMICR  See below: 02/01/2019   WBCUA 6-10 (A) 02/01/2019   RBCUA None seen 10/24/2018   LABEPIT None seen 02/01/2019   BACTERIA NONE SEEN 03/15/2020    Pertinent Imaging: N/a  No results found for this or any previous visit.  No results found for this or any previous visit.  No results found for this or any previous visit.  No results found for this or any previous visit.  No results found for this or any previous visit.  No results found for this or any previous visit.  No results found for this or any previous visit.  No results found for this or any previous visit.   Assessment & Plan:    1. Frequency, urgency - solifenacin refilled. UA no hematuria.   2. Inc emptying - stable. Check PVR next time.  3. Bacteriuria - avoid abx for asymptomatic bacteriuria.     No follow-ups on file.  Festus Aloe, MD  [facility] [address] [place], [place] [address] [phone]

## 2020-03-20 NOTE — Patient Instructions (Signed)
Pelvic Floor Dysfunction  Pelvic floor dysfunction (PFD) is a condition that results when the group of muscles and connective tissues that support the organs in the pelvis (pelvic floor muscles) do not work well. These muscles and their connections form a sling that supports the colon and bladder. In men, these muscles also support the prostate gland. In women, they also support the uterus. PFD causes pelvic floor muscles to be too weak, too tight, or a combination of both. In PFD, muscle movements are not coordinated. This condition may cause bowel or bladder problems. It may also cause pain. What are the causes? This condition may be caused by an injury to the pelvic area or by a weakening of pelvic muscles. This often results from pregnancy and childbirth or other types of strain. In many cases, the exact cause is not known. What increases the risk? The following factors may make you more likely to develop this condition:  Having a condition of chronic bladder tissue inflammation (interstitial cystitis).  Being an older person.  Being overweight.  Radiation treatment for cancer in the pelvic region.  Previous pelvic surgery, such as removal of the uterus (hysterectomy) or prostate gland (prostatectomy). What are the signs or symptoms? Symptoms of this condition vary and may include:  Bladder symptoms, such as: ? Trouble starting urination and emptying the bladder. ? Frequent urinary tract infections. ? Leaking urine when coughing, laughing, or exercising (stress incontinence). ? Having to pass urine urgently or frequently. ? Pain when passing urine.  Bowel symptoms, such as: ? Constipation. ? Urgent or frequent bowel movements. ? Incomplete bowel movements. ? Painful bowel movements. ? Leaking stool or gas.  Unexplained genital or rectal pain.  Genital or rectal muscle spasms.  Low back pain. In women, symptoms of PFD may also include:  A heavy, full, or aching feeling in  the vagina.  A bulge that protrudes into the vagina.  Pain during or after sexual intercourse. How is this diagnosed? This condition may be diagnosed based on:  Your symptoms and medical history.  A physical exam. During the exam, your health care provider may check your pelvic muscles for tightness, spasm, pain, or weakness. This may include a rectal exam and a pelvic exam for women. In some cases, you may have diagnostic tests, such as:  Electrical muscle function tests.  Urine flow testing.  X-ray tests of bowel function.  Ultrasound of the pelvic organs. How is this treated? Treatment for this condition depends on your symptoms. Treatment options include:  Physical therapy. This may include Kegel exercises to help relax or strengthen the pelvic floor muscles.  Biofeedback. This type of therapy provides feedback on how tight your pelvic floor muscles are so that you can learn to control them.  Internal or external massage therapy.  A treatment that involves electrical stimulation of the pelvic floor muscles to help control pain (transcutaneous electrical nerve stimulation, or TENS).  Sound wave therapy (ultrasound) to reduce muscle spasms.  Medicines, such as: ? Muscle relaxants. ? Bladder control medicines. Surgery to reconstruct or support pelvic floor muscles may be an option if other treatments do not help. Follow these instructions at home: Activity  Do your usual activities as told by your health care provider. Ask your health care provider if you should modify any activities.  Do pelvic floor strengthening or relaxing exercises at home as told by your physical therapist. Lifestyle  Maintain a healthy weight.  Eat foods that are high in fiber, such as  beans, whole grains, and fresh fruits and vegetables.  Limit foods that are high in fat and processed sugars, such as fried or sweet foods.  Manage stress with relaxation techniques such as yoga or  meditation. General instructions  If you have problems with leakage: ? Use absorbable pads or wear padded underwear. ? Wash frequently with mild soap. ? Keep your genital and anal area as clean and dry as possible. ? Ask your health care provider if you should try a barrier cream to prevent skin irritation.  Take warm baths to relieve pelvic muscle tension or spasms.  Take over-the-counter and prescription medicines only as told by your health care provider.  Keep all follow-up visits as told by your health care provider. This is important. Contact a health care provider if you:  Are not improving with home care.  Have signs or symptoms of PFD that get worse at home.  Develop new signs or symptoms at home.  Have signs of a urinary tract infection, such as: ? Fever. ? Chills. ? Urinary frequency. ? A burning feeling when urinating.  Have not had a bowel movement in 3 days (constipation). Summary  Pelvic floor dysfunction results when the muscles and connective tissues in your pelvic floor do not work well.  These muscles and their connections form a sling that supports your colon and bladder. In men, these muscles also support the prostate gland. In women, they also support the uterus.  PFD may be caused by an injury to the pelvic area or by a weakening of pelvic muscles.  PFD causes pelvic floor muscles to be too weak, too tight, or a combination of both. Symptoms may vary from person to person.  In most cases, PFD can be treated with physical therapies and medicines. Surgery may be an option if other treatments do not help. This information is not intended to replace advice given to you by your health care provider. Make sure you discuss any questions you have with your health care provider. Document Revised: 03/05/2018 Document Reviewed: 03/05/2018 Elsevier Patient Education  [place].   Overactive Bladder, Adult  Overactive bladder refers to a condition in  which a person has a sudden need to pass urine. The person may leak urine if he or she cannot get to the bathroom fast enough (urinary incontinence). A person with this condition may also wake up several times in the night to go to the bathroom. Overactive bladder is associated with poor nerve signals between your bladder and your brain. Your bladder may get the signal to empty before it is full. You may also have very sensitive muscles that make your bladder squeeze too soon. These symptoms might interfere with daily work or social activities. What are the causes? This condition may be associated with or caused by:  Urinary tract infection.  Infection of nearby tissues, such as the prostate.  Prostate enlargement.  Surgery on the uterus or urethra.  Bladder stones, inflammation, or tumors.  Drinking too much caffeine or alcohol.  Certain medicines, especially medicines that get rid of extra fluid in the body (diuretics).  Muscle or nerve weakness, especially from: ? A spinal cord injury. ? Stroke. ? Multiple sclerosis. ? Parkinson's disease.  Diabetes.  Constipation. What increases the risk? You may be at greater risk for overactive bladder if you:  Are an older adult.  Smoke.  Are going through menopause.  Have prostate problems.  Have a neurological disease, such as stroke, dementia, Parkinson's disease, or  multiple sclerosis (MS).  Eat or drink things that irritate the bladder. These include alcohol, spicy food, and caffeine.  Are overweight or obese. What are the signs or symptoms? Symptoms of this condition include:  Sudden, strong urge to urinate.  Leaking urine.  Urinating 8 or more times a day.  Waking up to urinate 2 or more times a night. How is this diagnosed? Your health care provider may suspect overactive bladder based on your symptoms. He or she will diagnose this condition by:  A physical exam and medical history.  Blood or urine tests. You  might need bladder or urine tests to help determine what is causing your overactive bladder. You might also need to see a health care provider who specializes in urinary tract problems (urologist). How is this treated? Treatment for overactive bladder depends on the cause of your condition and whether it is mild or severe. You can also make lifestyle changes at home. Options include:  Bladder training. This may include: ? Learning to control the urge to urinate by following a schedule that directs you to urinate at regular intervals (timed voiding). ? Doing Kegel exercises to strengthen your pelvic floor muscles, which support your bladder. Toning these muscles can help you control urination, even if your bladder muscles are overactive.  Special devices. This may include: ? Biofeedback, which uses sensors to help you become aware of your body's signals. ? Electrical stimulation, which uses electrodes placed inside the body (implanted) or outside the body. These electrodes send gentle pulses of electricity to strengthen the nerves or muscles that control the bladder. ? Women may use a plastic device that fits into the vagina and supports the bladder (pessary).  Medicines. ? Antibiotics to treat bladder infection. ? Antispasmodics to stop the bladder from releasing urine at the wrong time. ? Tricyclic antidepressants to relax bladder muscles. ? Injections of botulinum toxin type A directly into the bladder tissue to relax bladder muscles.  Lifestyle changes. This may include: ? Weight loss. Talk to your health care provider about weight loss methods that would work best for you. ? Diet changes. This may include reducing how much alcohol and caffeine you consume, or drinking fluids at different times of the day. ? Not smoking. Do not use any products that contain nicotine or tobacco, such as cigarettes and e-cigarettes. If you need help quitting, ask your health care provider.  Surgery. ? A  device may be implanted to help manage the nerve signals that control urination. ? An electrode may be implanted to stimulate electrical signals in the bladder. ? A procedure may be done to change the shape of the bladder. This is done only in very severe cases. Follow these instructions at home: Lifestyle  Make any diet or lifestyle changes that are recommended by your health care provider. These may include: ? Drinking less fluid or drinking fluids at different times of the day. ? Cutting down on caffeine or alcohol. ? Doing Kegel exercises. ? Losing weight if needed. ? Eating a healthy and balanced diet to prevent constipation. This may include:  Eating foods that are high in fiber, such as fresh fruits and vegetables, whole grains, and beans.  Limiting foods that are high in fat and processed sugars, such as fried and sweet foods. General instructions  Take over-the-counter and prescription medicines only as told by your health care provider.  If you were prescribed an antibiotic medicine, take it as told by your health care provider. Do not stop  taking the antibiotic even if you start to feel better.  Use any implants or pessary as told by your health care provider.  If needed, wear pads to absorb urine leakage.  Keep a journal or log to track how much and when you drink and when you feel the need to urinate. This will help your health care provider monitor your condition.  Keep all follow-up visits as told by your health care provider. This is important. Contact a health care provider if:  You have a fever.  Your symptoms do not get better with treatment.  Your pain and discomfort get worse.  You have more frequent urges to urinate. Get help right away if:  You are not able to control your bladder. Summary  Overactive bladder refers to a condition in which a person has a sudden need to pass urine.  Several conditions may lead to an overactive bladder.  Treatment  for overactive bladder depends on the cause and severity of your condition.  Follow your health care provider's instructions about lifestyle changes, doing Kegel exercises, keeping a journal, and taking medicines. This information is not intended to replace advice given to you by your health care provider. Make sure you discuss any questions you have with your health care provider. Document Revised: 12/06/2018 Document Reviewed: 08/31/2017 Elsevier Patient Education  [place].

## 2020-05-01 ENCOUNTER — Ambulatory Visit: Payer: Self-pay | Admitting: Urology

## 2020-09-08 ENCOUNTER — Other Ambulatory Visit: Payer: Self-pay | Admitting: Family Medicine

## 2020-09-08 DIAGNOSIS — R053 Chronic cough: Secondary | ICD-10-CM

## 2020-09-21 ENCOUNTER — Ambulatory Visit: Payer: Medicare Other

## 2020-09-30 ENCOUNTER — Ambulatory Visit
Admission: RE | Admit: 2020-09-30 | Discharge: 2020-09-30 | Disposition: A | Discharge status: Home / Self Care | Payer: Medicare Other | Source: Ambulatory Visit | Attending: Family Medicine | Admitting: Family Medicine

## 2020-09-30 ENCOUNTER — Other Ambulatory Visit: Payer: Self-pay

## 2020-09-30 DIAGNOSIS — R053 Chronic cough: Secondary | ICD-10-CM | POA: Insufficient documentation

## 2021-02-24 ENCOUNTER — Telehealth: Payer: Self-pay

## 2021-02-24 NOTE — Telephone Encounter (Signed)
Received faxed request from [facility] for patients [facility] refill 90 days with 3 refills. Patient is a former Dr. Junious Silk patient who is due to see Dr.Stoioff in July. Left patient a message to return call to get patients local pharmacy to send in one month, enough to get her to her July appt with Dr Bernardo Heater.

## 2021-03-09 ENCOUNTER — Other Ambulatory Visit: Payer: Self-pay | Admitting: Family Medicine

## 2021-03-09 DIAGNOSIS — R109 Unspecified abdominal pain: Secondary | ICD-10-CM

## 2021-03-09 DIAGNOSIS — R1011 Right upper quadrant pain: Secondary | ICD-10-CM

## 2021-03-19 ENCOUNTER — Other Ambulatory Visit: Payer: Self-pay

## 2021-03-19 ENCOUNTER — Ambulatory Visit (INDEPENDENT_AMBULATORY_CARE_PROVIDER_SITE_OTHER): Payer: Medicare Other | Admitting: Urology

## 2021-03-19 ENCOUNTER — Encounter: Payer: Self-pay | Admitting: Urology

## 2021-03-19 VITALS — BP 127/75 | HR 75 | Ht 62.0 in | Wt 157.0 lb

## 2021-03-19 DIAGNOSIS — N3281 Overactive bladder: Secondary | ICD-10-CM | POA: Diagnosis not present

## 2021-03-19 DIAGNOSIS — N390 Urinary tract infection, site not specified: Secondary | ICD-10-CM

## 2021-03-19 DIAGNOSIS — R350 Frequency of micturition: Secondary | ICD-10-CM

## 2021-03-19 MED ORDER — SOLIFENACIN SUCCINATE 5 MG PO TABS: 5.0000 mg | ORAL_TABLET | Freq: Every day | ORAL | 3 refills | Status: DC

## 2021-03-19 NOTE — Progress Notes (Signed)
03/19/2021 10:30 AM   Molly Figueroa Molly Figueroa 02/16/1931 [id number]  Referring provider: Donnamarie Rossetti, PA-C [place],  [place] [address]  Chief Complaint  Patient presents with   Urinary Frequency    Urologic history: 1.  Recurrent UTI See Dr. Junious Figueroa note 03/20/2020 for clinical summary  2.  Lower urinary tract symptoms with incomplete bladder emptying Prior negative evaluation and failed CIC, tamsulosin, PTNS Started Solifenacin 2020 with significant symptom improvement   HPI: 87 y.o. female presents for annual follow-up.  Previously followed by Dr. Junious Figueroa Since last years visit has had 1 urine culture which showed mixed flora PCP urinalysis 03/09/2021 was normal She states no UTIs in the last 12 months Continues to do well on Solifenacin   PMH: Past Medical History:  Diagnosis Date   Allergies    Frequent UTI    Hypertension    IBS (irritable bowel syndrome)     Surgical History: Past Surgical History:  Procedure Laterality Date   ABDOMINAL HYSTERECTOMY     APPENDECTOMY     BACK SURGERY     HIP ARTHROPLASTY     JOINT REPLACEMENT  01/22/2019   R hip replacement   TONSILLECTOMY      Home Medications:  Allergies as of 03/19/2021       Reactions   Amlodipine Swelling   Codeine Nausea And Vomiting   Gabapentin Itching   Gentak [gentamicin Sulfate] Itching   Phenothiazines Nausea And Vomiting   Tobradex [tobramycin-dexamethasone] Itching   Wellbutrin [bupropion] Swelling   Throat pain   Cefuroxime Axetil Rash   Fexofenadine Cough   Other reaction(s): Headache   Macrolides And Ketolides Rash   Sulfa Antibiotics Rash        Medication List        Accurate as of March 19, 2021 10:30 AM. If you have any questions, ask your nurse or doctor.          acetaminophen 500 MG tablet Commonly known as: TYLENOL Take 500 mg by mouth every 6 (six) hours as needed for mild pain or fever.   acidophilus Caps capsule Take 1  capsule by mouth daily.   aspirin EC 81 MG tablet Take 81 mg by mouth daily.   azelastine 0.1 % nasal spray Commonly known as: ASTELIN Place 1 spray into both nostrils 2 (two) times daily.   cetirizine 10 MG tablet Commonly known as: ZYRTEC Take 10 mg by mouth daily.   cholecalciferol 25 MCG (1000 UNIT) tablet Commonly known as: VITAMIN D3 Take 1,000 Units by mouth daily.   cholestyramine 4 g packet Commonly known as: QUESTRAN Take 4 g by mouth 2 (two) times daily.   cholestyramine 4 GM/DOSE powder Commonly known as: QUESTRAN SMARTSIG:1 Each By Mouth Twice Daily   cyanocobalamin 1000 MCG/ML injection Commonly known as: (VITAMIN B-12) Inject into the muscle.   diclofenac sodium 1 % Gel Commonly known as: VOLTAREN Apply 2 g topically 4 (four) times daily.   diclofenac Sodium 1 % Gel Commonly known as: VOLTAREN Apply 2 g topically 4 (four) times daily.   escitalopram 20 MG tablet Commonly known as: LEXAPRO Take 20 mg by mouth daily.   lidocaine 5 % Commonly known as: LIDODERM Place 1 patch onto the skin daily. Remove & Discard patch within 12 hours or as directed by MD   meloxicam 7.5 MG tablet Commonly known as: MOBIC Take 7.5 mg by mouth daily.   multivitamin with minerals Tabs tablet Take 1 tablet by mouth daily.  Olopatadine HCl 0.2 % Soln Place 1 drop into both eyes daily as needed (eye irritations).   omeprazole 20 MG capsule Commonly known as: PRILOSEC Take 20 mg by mouth 2 (two) times daily before a meal.   ondansetron 4 MG tablet Commonly known as: ZOFRAN Zofran 4 mg tablet  Take 2 tablets twice a day by oral route.   solifenacin 5 MG tablet Commonly known as: VESICARE Take 1 tablet (5 mg total) by mouth daily.   telmisartan 20 MG tablet Commonly known as: MICARDIS Take 40 mg by mouth daily.   traMADol 50 MG tablet Commonly known as: ULTRAM Take 1 tablet (50 mg total) by mouth every 8 (eight) hours as needed for moderate pain or severe  pain.   triamterene-hydrochlorothiazide 37.5-25 MG tablet Commonly known as: MAXZIDE-25 Take 0.5 tablets by mouth daily.        Allergies:  Allergies  Allergen Reactions   Amlodipine Swelling   Codeine Nausea And Vomiting   Gabapentin Itching   Gentak [Gentamicin Sulfate] Itching   Phenothiazines Nausea And Vomiting   Tobradex [Tobramycin-Dexamethasone] Itching   Wellbutrin [Bupropion] Swelling    Throat pain    Cefuroxime Axetil Rash   Fexofenadine Cough    Other reaction(s): Headache   Macrolides And Ketolides Rash   Sulfa Antibiotics Rash    Family History: Family History  Problem Relation Age of Onset   CAD Mother    Bladder Cancer Neg Hx    Kidney cancer Neg Hx     Social History:  reports that she has never smoked. She has never used smokeless tobacco. She reports previous alcohol use. She reports that she does not use drugs.   Physical Exam: BP 127/75   Pulse 75   Ht '5\' 2"'$  (1.575 m)   Wt 157 lb (71.2 kg)   BMI 28.72 kg/m   Constitutional:  Alert, No acute distress. HEENT: [place] AT, moist mucus membranes.  Trachea midline, no masses. Cardiovascular: No clubbing, cyanosis, or edema. Respiratory: Normal respiratory effort, no increased work of breathing. Psychiatric: Normal mood and affect.   Assessment & Plan:    1.  Recurrent UTI No infections in the last year Urinalysis earlier this month was clear  2.  Overactive bladder Doing well on Solifenacin 5 mg Refill sent to pharmacy  Continue annual follow-up   Molly Figueroa, [facility] [address] [place], [place] [address] [phone]

## 2021-03-19 NOTE — Addendum Note (Signed)
Addended by: Chrystie Nose on: 03/19/2021 11:44 AM   Modules accepted: Orders

## 2021-05-18 ENCOUNTER — Other Ambulatory Visit: Payer: Self-pay | Admitting: Family Medicine

## 2021-05-18 DIAGNOSIS — R519 Headache, unspecified: Secondary | ICD-10-CM

## 2021-06-01 ENCOUNTER — Other Ambulatory Visit: Payer: Self-pay

## 2021-06-01 ENCOUNTER — Ambulatory Visit
Admission: RE | Admit: 2021-06-01 | Discharge: 2021-06-01 | Disposition: A | Discharge status: Home / Self Care | Payer: Medicare Other | Source: Ambulatory Visit | Attending: Family Medicine | Admitting: Family Medicine

## 2021-06-01 DIAGNOSIS — R519 Headache, unspecified: Secondary | ICD-10-CM | POA: Diagnosis present

## 2021-06-10 ENCOUNTER — Ambulatory Visit
Admission: RE | Admit: 2021-06-10 | Discharge: 2021-06-10 | Disposition: A | Discharge status: Home / Self Care | Payer: Medicare Other | Source: Ambulatory Visit | Attending: Family Medicine | Admitting: Family Medicine

## 2021-06-10 ENCOUNTER — Other Ambulatory Visit: Payer: Self-pay

## 2021-06-10 DIAGNOSIS — R109 Unspecified abdominal pain: Secondary | ICD-10-CM | POA: Insufficient documentation

## 2021-06-10 DIAGNOSIS — R1011 Right upper quadrant pain: Secondary | ICD-10-CM | POA: Insufficient documentation

## 2021-07-06 ENCOUNTER — Ambulatory Visit
Admission: RE | Admit: 2021-07-06 | Discharge: 2021-07-06 | Disposition: A | Discharge status: Home / Self Care | Payer: Medicare Other | Source: Ambulatory Visit | Attending: Family Medicine | Admitting: Family Medicine

## 2021-07-06 ENCOUNTER — Other Ambulatory Visit: Payer: Self-pay | Admitting: Family Medicine

## 2021-07-06 ENCOUNTER — Other Ambulatory Visit: Payer: Self-pay

## 2021-07-06 DIAGNOSIS — R1032 Left lower quadrant pain: Secondary | ICD-10-CM | POA: Insufficient documentation

## 2021-07-06 MED ORDER — IOHEXOL 350 MG/ML SOLN
75.0000 mL | Freq: Once | INTRAVENOUS | Status: AC | PRN
  Administered 2021-07-06: 75 mL via INTRAVENOUS

## 2021-07-08 ENCOUNTER — Other Ambulatory Visit: Payer: Self-pay | Admitting: Family Medicine

## 2021-07-08 DIAGNOSIS — N289 Disorder of kidney and ureter, unspecified: Secondary | ICD-10-CM

## 2021-07-08 DIAGNOSIS — N2889 Other specified disorders of kidney and ureter: Secondary | ICD-10-CM

## 2021-07-21 ENCOUNTER — Ambulatory Visit
Admission: RE | Admit: 2021-07-21 | Discharge: 2021-07-21 | Disposition: A | Discharge status: Home / Self Care | Payer: Medicare Other | Source: Ambulatory Visit | Attending: Family Medicine | Admitting: Family Medicine

## 2021-07-21 ENCOUNTER — Other Ambulatory Visit: Payer: Self-pay

## 2021-07-21 DIAGNOSIS — N289 Disorder of kidney and ureter, unspecified: Secondary | ICD-10-CM | POA: Insufficient documentation

## 2021-07-21 DIAGNOSIS — N2889 Other specified disorders of kidney and ureter: Secondary | ICD-10-CM | POA: Diagnosis not present

## 2021-08-06 ENCOUNTER — Encounter: Payer: Self-pay | Admitting: Urology

## 2021-08-06 ENCOUNTER — Ambulatory Visit (INDEPENDENT_AMBULATORY_CARE_PROVIDER_SITE_OTHER): Payer: Medicare Other | Admitting: Urology

## 2021-08-06 ENCOUNTER — Other Ambulatory Visit: Payer: Self-pay

## 2021-08-06 VITALS — BP 149/84 | HR 78 | Ht 62.0 in | Wt 153.0 lb

## 2021-08-06 DIAGNOSIS — R339 Retention of urine, unspecified: Secondary | ICD-10-CM | POA: Diagnosis not present

## 2021-08-06 DIAGNOSIS — N390 Urinary tract infection, site not specified: Secondary | ICD-10-CM | POA: Diagnosis not present

## 2021-08-06 DIAGNOSIS — N2889 Other specified disorders of kidney and ureter: Secondary | ICD-10-CM

## 2021-08-06 DIAGNOSIS — N3281 Overactive bladder: Secondary | ICD-10-CM

## 2021-08-06 DIAGNOSIS — D1771 Benign lipomatous neoplasm of kidney: Secondary | ICD-10-CM

## 2021-08-06 LAB — URINALYSIS, COMPLETE
Bilirubin, UA: NEGATIVE
Glucose, UA: NEGATIVE
Ketones, UA: NEGATIVE
Nitrite, UA: NEGATIVE
Protein,UA: NEGATIVE
RBC, UA: NEGATIVE
Specific Gravity, UA: 1.01 (ref 1.005–1.030)
Urobilinogen, Ur: 0.2 mg/dL (ref 0.2–1.0)
pH, UA: 6 (ref 5.0–7.5)

## 2021-08-06 LAB — MICROSCOPIC EXAMINATION
Bacteria, UA: NONE SEEN
Epithelial Cells (non renal): NONE SEEN /hpf (ref 0–10)
RBC, Urine: NONE SEEN /hpf (ref 0–2)
WBC, UA: NONE SEEN /hpf (ref 0–5)

## 2021-08-06 LAB — BLADDER SCAN AMB NON-IMAGING: Scan Result: 303

## 2021-08-06 MED ORDER — GEMTESA 75 MG PO TABS: 75.0000 mg | ORAL_TABLET | Freq: Every day | ORAL | 0 refills | Status: AC

## 2021-08-06 NOTE — Progress Notes (Signed)
08/06/2021 10:57 AM   Molly Figueroa Molly Figueroa 10/19/1930 [id number]  Referring provider: Donnamarie Rossetti, PA-C Molly Figueroa,  Molly Figuero[place] [id number]  Chief Complaint  Patient presents with   Recurrent UTI    Urologic history: 1.  Recurrent UTI See Dr. Junious Figueroa note 03/20/2020 for clinical summary   2.  Lower urinary tract symptoms with incomplete bladder emptying Prior negative evaluation and failed CIC, tamsulosin, PTNS Started Solifenacin 2020 with significant symptom improvement   HPI: 87 y.o. female last seen July 2022 who called for an earlier appointment for worsening lower urinary tract symptoms.  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  There was a 10 mm exophytic lesion of the right lower pole with questionable enhancement.  MRI was recommended which was performed 07/21/2021 The angiomyolipoma measured 1.8 x 2.6 on MR.  The right lower pole lesion was felt to represent a cyst or minimally complex cyst.  She also had a 5 mm upper pole lesion felt to represent a small proteinaceous cyst Bladder scan PVR today 303 mL   PMH: Past Medical History:  Diagnosis Date   Allergies    Frequent UTI    Hypertension    IBS (irritable bowel syndrome)     Surgical History: Past Surgical History:  Procedure Laterality Date   ABDOMINAL HYSTERECTOMY     APPENDECTOMY     BACK SURGERY     HIP ARTHROPLASTY     JOINT REPLACEMENT  01/22/2019   R hip replacement   TONSILLECTOMY      Home Medications:  Allergies as of 08/06/2021       Reactions   Amlodipine Swelling   Codeine Nausea And Vomiting   Gabapentin Itching   Gentak [gentamicin Sulfate] Itching   Penicillin G    Phenothiazines Nausea And Vomiting   Tobradex  [tobramycin-dexamethasone] Itching   Wellbutrin [bupropion] Swelling   Throat pain   Cefuroxime Axetil Rash   Fexofenadine Cough   Other reaction(s): Headache   Levofloxacin Rash   Macrolides And Ketolides Rash   Sulfa Antibiotics Rash        Medication List        Accurate as of August 06, 2021 10:57 AM. If you have any questions, ask your nurse or doctor.          acetaminophen 500 MG tablet Commonly known as: TYLENOL Take 500 mg by mouth every 6 (six) hours as needed for mild pain or fever.   acidophilus Caps capsule Take 1 capsule by mouth daily.   aspirin EC 81 MG tablet Take 81 mg by mouth daily.   azelastine 0.1 % nasal spray Commonly known as: ASTELIN Place 1 spray into both nostrils 2 (two) times daily.   cetirizine 10 MG tablet Commonly known as: ZYRTEC Take 10 mg by mouth daily.   cholecalciferol 25 MCG (1000 UNIT) tablet Commonly known as: VITAMIN D3 Take 1,000 Units by mouth daily.   cholestyramine 4 g packet Commonly known as: QUESTRAN Take 4 g by mouth 2 (two) times daily.   cholestyramine 4 GM/DOSE powder Commonly known as: QUESTRAN SMARTSIG:1 Each By Mouth Twice Daily   cyanocobalamin 1000 MCG/ML injection Commonly known as: (VITAMIN B-12) Inject into the muscle.   diclofenac sodium 1 % Gel Commonly known as: VOLTAREN Apply 2 g topically  4 (four) times daily.   diclofenac Sodium 1 % Gel Commonly known as: VOLTAREN Apply 2 g topically 4 (four) times daily.   escitalopram 20 MG tablet Commonly known as: LEXAPRO Take 20 mg by mouth daily.   lidocaine 5 % Commonly known as: LIDODERM Place 1 patch onto the skin daily. Remove & Discard patch within 12 hours or as directed by MD   meloxicam 7.5 MG tablet Commonly known as: MOBIC Take 7.5 mg by mouth daily.   multivitamin with minerals Tabs tablet Take 1 tablet by mouth daily.   Olopatadine HCl 0.2 % Soln Place 1 drop into both eyes daily as needed (eye irritations).    omeprazole 20 MG capsule Commonly known as: PRILOSEC Take 20 mg by mouth 2 (two) times daily before a meal.   ondansetron 4 MG tablet Commonly known as: ZOFRAN Zofran 4 mg tablet  Take 2 tablets twice a day by oral route.   solifenacin 5 MG tablet Commonly known as: VESICARE Take 1 tablet (5 mg total) by mouth daily.   telmisartan 20 MG tablet Commonly known as: MICARDIS Take 40 mg by mouth daily.   traMADol 50 MG tablet Commonly known as: ULTRAM Take 1 tablet (50 mg total) by mouth every 8 (eight) hours as needed for moderate pain or severe pain.   triamterene-hydrochlorothiazide 37.5-25 MG tablet Commonly known as: MAXZIDE-25 Take 0.5 tablets by mouth daily.        Allergies:  Allergies  Allergen Reactions   Amlodipine Swelling   Codeine Nausea And Vomiting   Gabapentin Itching   Gentak [Gentamicin Sulfate] Itching   Penicillin G    Phenothiazines Nausea And Vomiting   Tobradex [Tobramycin-Dexamethasone] Itching   Wellbutrin [Bupropion] Swelling    Throat pain    Cefuroxime Axetil Rash   Fexofenadine Cough    Other reaction(s): Headache   Levofloxacin Rash   Macrolides And Ketolides Rash   Sulfa Antibiotics Rash    Family History: Family History  Problem Relation Age of Onset   CAD Mother    Bladder Cancer Neg Hx    Kidney cancer Neg Hx     Social History:  reports that she has never smoked. She has never used smokeless tobacco. She reports that she does not currently use alcohol. She reports that she does not use drugs.   Physical Exam: BP (!) 149/84   Pulse 78   Ht 5\' 2"  (1.575 m)   Wt 153 lb (69.4 kg)   BMI 27.98 kg/m   Constitutional:  Alert and oriented, No acute distress. HEENT: [place] AT, moist mucus membranes.  Trachea midline, no masses. Cardiovascular: No clubbing, cyanosis, or edema. Respiratory: Normal respiratory effort, no increased work of breathing. Psychiatric: Normal mood and affect.  Laboratory Data:  Urinalysis Dipstick  trace leukocytes/microscopy negative   Assessment & Plan:    1.  Lower urinary tract symptoms with incomplete bladder emptying PVR increased today Has been unable to do CIC in the past We will have her discontinue Solifenacin Given samples Gemtesa 75 mg daily PA follow-up 1 month with repeat symptom check and repeat bladder scan  2. Recurrent UTI Urinalysis today clear  3. Right angiomyolipoma 2.6 cm by MRI  4.  Right renal masses Small lesions incompletely characterized by renal mass MRI protocol but felt to represent benign cysts    Abbie Sons, MD  [facility] [address] [place], [place] [address] [phone]

## 2021-09-07 NOTE — Progress Notes (Signed)
09/08/2021 11:11 AM   [name] [name] [birth year] [id number]  Referring provider: Donnamarie Rossetti, PA-C [place],  [place] [address]  Chief Complaint  Patient presents with   Over Active Bladder   Urological history 1. rUTI's -contributing factors of age, vaginal atrophy, IBS, incomplete bladder emptying -cysto's 2012, 2014 and 2016 - NED -contrast CT 2022 - NED  -Positive documented urine cultures over the last year   2. Renal cysts -MRI 06/2021 - lesion of interest in the lower pole right kidney is incompletely characterized secondary to lack of IV contrast, given its T2 hyperintensity, strongly favored to represent a cyst or minimally complex cyst.  3. Angiomyolipoma -contrast CT 2022 - 3.5 cm fat density mass off the upper pole right kidney consistent with angiomyolipoma  4. Incontinence -Contributing factors of age, vaginal atrophy, incomplete bladder emptying, hypertension, radiculopathy, TIAs and diuretics -Failed CIC, tamsulosin, solifenacin and PTNS -PVR 203 mL  5. LU TS -Followed at [facility] for years -see #4   HPI: Molly Figueroa is a 88 y.o. female who presents today for an office visit after trial of Gemtesa 75 mg daily for her lower urinary tract symptoms.   She is having 8 or more daytime urinations, 1-2 nighttime urinations with a strong urge to urinate.  She leaks with stress.  She has 3 or more incontinent episodes weekly.  She wears absorbent products when she leaves the house.  She is wearing 1 daily.  She will limit fluid intake and engage in toilet mapping on outings.  She feels that the Alafaya did not help.  Her urgency is the most bothersome symptom.    PVR 203 mL  When I questioned her about her previous failed therapies, she stated she had never been tried on PTNS.   PMH: Past Medical History:  Diagnosis Date   Allergies    Frequent UTI    Hypertension    IBS (irritable bowel syndrome)     Surgical  History: Past Surgical History:  Procedure Laterality Date   ABDOMINAL HYSTERECTOMY     APPENDECTOMY     BACK SURGERY     HIP ARTHROPLASTY     JOINT REPLACEMENT  01/22/2019   R hip replacement   TONSILLECTOMY      Home Medications:  Allergies as of 09/08/2021       Reactions   Amlodipine Swelling   Codeine Nausea And Vomiting   Gabapentin Itching   Gentak [gentamicin Sulfate] Itching   Penicillin G    Phenothiazines Nausea And Vomiting   Tobradex [tobramycin-dexamethasone] Itching   Wellbutrin [bupropion] Swelling   Throat pain   Cefuroxime Axetil Rash   Fexofenadine Cough   Other reaction(s): Headache   Levofloxacin Rash   Macrolides And Ketolides Rash   Sulfa Antibiotics Rash        Medication List        Accurate as of September 08, 2021 11:11 AM. If you have any questions, ask your nurse or doctor.          acetaminophen 500 MG tablet Commonly known as: TYLENOL Take 500 mg by mouth every 6 (six) hours as needed for mild pain or fever.   acidophilus Caps capsule Take 1 capsule by mouth daily.   aspirin EC 81 MG tablet Take 81 mg by mouth daily.   azelastine 0.1 % nasal spray Commonly known as: ASTELIN Place 1 spray into both nostrils 2 (two) times daily.   cetirizine 10 MG tablet  Commonly known as: ZYRTEC Take 10 mg by mouth daily.   cholecalciferol 25 MCG (1000 UNIT) tablet Commonly known as: VITAMIN D3 Take 1,000 Units by mouth daily.   cholestyramine 4 g packet Commonly known as: QUESTRAN Take 4 g by mouth 2 (two) times daily.   cholestyramine 4 GM/DOSE powder Commonly known as: QUESTRAN SMARTSIG:1 Each By Mouth Twice Daily   cyanocobalamin 1000 MCG/ML injection Commonly known as: (VITAMIN B-12) Inject into the muscle.   diclofenac sodium 1 % Gel Commonly known as: VOLTAREN Apply 2 g topically 4 (four) times daily.   diclofenac Sodium 1 % Gel Commonly known as: VOLTAREN Apply 2 g topically 4 (four) times daily.   escitalopram  20 MG tablet Commonly known as: LEXAPRO Take 20 mg by mouth daily.   Gemtesa 75 MG Tabs Generic drug: Vibegron Take 75 mg by mouth daily.   lidocaine 5 % Commonly known as: LIDODERM Place 1 patch onto the skin daily. Remove & Discard patch within 12 hours or as directed by MD   meloxicam 7.5 MG tablet Commonly known as: MOBIC Take 7.5 mg by mouth daily.   multivitamin with minerals Tabs tablet Take 1 tablet by mouth daily.   Olopatadine HCl 0.2 % Soln Place 1 drop into both eyes daily as needed (eye irritations).   omeprazole 20 MG capsule Commonly known as: PRILOSEC Take 20 mg by mouth 2 (two) times daily before a meal.   ondansetron 4 MG tablet Commonly known as: ZOFRAN Zofran 4 mg tablet  Take 2 tablets twice a day by oral route.   telmisartan 20 MG tablet Commonly known as: MICARDIS Take 40 mg by mouth daily.   traMADol 50 MG tablet Commonly known as: ULTRAM Take 1 tablet (50 mg total) by mouth every 8 (eight) hours as needed for moderate pain or severe pain.   triamterene-hydrochlorothiazide 37.5-25 MG tablet Commonly known as: MAXZIDE-25 Take 0.5 tablets by mouth daily.        Allergies:  Allergies  Allergen Reactions   Amlodipine Swelling   Codeine Nausea And Vomiting   Gabapentin Itching   Gentak [Gentamicin Sulfate] Itching   Penicillin G    Phenothiazines Nausea And Vomiting   Tobradex [Tobramycin-Dexamethasone] Itching   Wellbutrin [Bupropion] Swelling    Throat pain    Cefuroxime Axetil Rash   Fexofenadine Cough    Other reaction(s): Headache   Levofloxacin Rash   Macrolides And Ketolides Rash   Sulfa Antibiotics Rash    Family History: Family History  Problem Relation Age of Onset   CAD Mother    Bladder Cancer Neg Hx    Kidney cancer Neg Hx     Social History:  reports that she has never smoked. She has never used smokeless tobacco. She reports that she does not currently use alcohol. She reports that she does not use  drugs.  ROS: Pertinent ROS in HPI  Physical Exam: BP 135/75    Pulse 71    Ht '5\' 5"'  (1.651 m)    Wt 153 lb (69.4 kg)    BMI 25.46 kg/m   Constitutional:  Well nourished. Alert and oriented, No acute distress. HEENT: [place] AT, mask in place.  Trachea midline Cardiovascular: No clubbing, cyanosis, or edema. Respiratory: Normal respiratory effort, no increased work of breathing. Neurologic: Grossly intact, no focal deficits, moving all 4 extremities. Psychiatric: Normal mood and affect.    Laboratory Data: Urinalysis Component     Latest Ref Rng & Units 08/06/2021  Specific Gravity, UA  1.005 - 1.030 1.010  pH, UA     5.0 - 7.5 6.0  Color, UA     Yellow Yellow  Appearance Ur     Clear Clear  Leukocytes,UA     Negative Trace (A)  Protein,UA     Negative/Trace Negative  Glucose, UA     Negative Negative  Ketones, UA     Negative Negative  RBC, UA     Negative Negative  Bilirubin, UA     Negative Negative  Urobilinogen, Ur     0.2 - 1.0 mg/dL 0.2  Nitrite, UA     Negative Negative  Microscopic Examination      See below:   Component     Latest Ref Rng & Units 08/06/2021  WBC, UA     0 - 5 /hpf None seen  RBC     0 - 2 /hpf None seen  Epithelial Cells (non renal)     0 - 10 /hpf None seen  Bacteria, UA     None seen/Few None seen   Glucose 70 - 110 mg/dL 100   Sodium 136 - 145 mmol/L 137   Potassium 3.6 - 5.1 mmol/L 4.2   Chloride 97 - 109 mmol/L 104   Carbon Dioxide (CO2) 22.0 - 32.0 mmol/L 28.2   Urea Nitrogen (BUN) 7 - 25 mg/dL 11   Creatinine 0.6 - 1.1 mg/dL 1.1   Glomerular Filtration Rate (eGFR), MDRD Estimate >60 mL/min/1.73sq m 47 Low    Calcium 8.7 - 10.3 mg/dL 9.5   AST  8 - 39 U/L 22   ALT  5 - 38 U/L 18   Alk Phos (alkaline Phosphatase) 34 - 104 U/L 52   Albumin 3.5 - 4.8 g/dL 4.0   Bilirubin, Total 0.3 - 1.2 mg/dL 0.4   Protein, Total 6.1 - 7.9 g/dL 6.7   A/G Ratio 1.0 - 5.0 gm/dL 1.5   Resulting Agency  [facility]   Specimen Collected: 07/06/21 14:07 Last Resulted: 07/06/21 14:41  Received From: [facility]  Result Received: 07/06/21 14:53   WBC (White Blood Cell Count) 4.1 - 10.2 103/uL 6.4   RBC (Red Blood Cell Count) 4.04 - 5.48 106/uL 4.70   Hemoglobin 12.0 - 15.0 gm/dL 13.5   Hematocrit 35.0 - 47.0 % 41.2   MCV (Mean Corpuscular Volume) 80.0 - 100.0 fl 87.7   MCH (Mean Corpuscular Hemoglobin) 27.0 - 31.2 pg 28.7   MCHC (Mean Corpuscular Hemoglobin Concentration) 32.0 - 36.0 gm/dL 32.8   Platelet Count 150 - 450 103/uL 251   RDW-CV (Red Cell Distribution Width) 11.6 - 14.8 % 13.8   MPV (Mean Platelet Volume) 9.4 - 12.4 fl 10.2   Neutrophils 1.50 - 7.80 103/uL 3.71   Lymphocytes 1.00 - 3.60 103/uL 1.82   Monocytes 0.00 - 1.50 103/uL 0.50   Eosinophils 0.00 - 0.55 103/uL 0.25   Basophils 0.00 - 0.09 103/uL 0.06   Neutrophil % 32.0 - 70.0 % 58.4   Lymphocyte % 10.0 - 50.0 % 28.6   Monocyte % 4.0 - 13.0 % 7.9   Eosinophil % 1.0 - 5.0 % 3.9   Basophil% 0.0 - 2.0 % 0.9   Immature Granulocyte % <=0.7 % 0.3   Immature Granulocyte Count <=0.06 10^3/L 0.02   Resulting Agency  [facility]  Specimen Collected: 07/06/21 14:07 Last Resulted: 07/06/21 14:18  Received From: [facility]  Result Received: 07/06/21 14:31   Hemoglobin A1C  4.2 - 5.6 % 5.8 High    Average Blood Glucose (Calc) mg/dL 120   Resulting [facility]  Narrative Performed by [facility] Normal Range:    4.2 - 5.6%  Increased Risk:  5.7 - 6.4%  Diabetes:        >= 6.5%  Glycemic Control for adults with diabetes:  <7%   Specimen Collected: 03/09/21 10:49 Last Resulted: 03/09/21 12:34  Received From: [facility]  Result Received: 03/10/21 18:26  I have reviewed the labs.   Pertinent Imaging:  09/08/21 10:50  Scan Result 271m    Assessment & Plan:    1. LU TS -[facility]-As she  states she had not been tried on PTNS in the past, she would like to see if she is a candidate -explained the PTNS provides treatment by indirectly providing electrical stimulation to the nerves responsible for bladder and pelvic floor function - a needle electrode generates an adjustable electrical pulse that travels to the sacral plexus via the tibial nerve which is located in the ankle, among other functions, the sacral nerve plexus regulates bladder and pelvic floor function - treatment protocol requires once-a-week treatments for 12 weeks, 30 minutes per session and many patients begin to see improvements by the 6th treatment. Patients who respond to treatment may require occasional treatments (~ once every 3 weeks) to sustain improvements. PTNS is a low-risk procedure. The most common side-effects with PTNS treatment are temporary and minor, resulting from the placement of the needle electrode. They include minor bleeding, mild pain and skin inflammation and patients have seen up to an 80% success rate with this form of treatment - RTC for PTNS   2. Incontinence -Likely due to incomplete bladder emptying -We will hopefully get her scheduled for PTNS  3. Renal cysts -Favored to be benign  4.  Angiolipoma -Stable times several years  Return for Pending PTNS PA.  These notes generated with voice recognition software. I apologize for typographical errors.  SZara Council PA-C  [facility] [address] [place] [phone]

## 2021-09-08 ENCOUNTER — Other Ambulatory Visit: Payer: Self-pay

## 2021-09-08 ENCOUNTER — Encounter: Payer: Self-pay | Admitting: Urology

## 2021-09-08 ENCOUNTER — Ambulatory Visit (INDEPENDENT_AMBULATORY_CARE_PROVIDER_SITE_OTHER): Payer: Medicare Other | Admitting: Urology

## 2021-09-08 VITALS — BP 135/75 | HR 71 | Ht 65.0 in | Wt 153.0 lb

## 2021-09-08 DIAGNOSIS — N281 Cyst of kidney, acquired: Secondary | ICD-10-CM | POA: Diagnosis not present

## 2021-09-08 DIAGNOSIS — N3946 Mixed incontinence: Secondary | ICD-10-CM

## 2021-09-08 DIAGNOSIS — R399 Unspecified symptoms and signs involving the genitourinary system: Secondary | ICD-10-CM | POA: Diagnosis not present

## 2021-09-08 DIAGNOSIS — D1771 Benign lipomatous neoplasm of kidney: Secondary | ICD-10-CM | POA: Diagnosis not present

## 2021-09-08 LAB — BLADDER SCAN AMB NON-IMAGING

## 2021-09-13 ENCOUNTER — Telehealth: Payer: Self-pay

## 2021-09-13 NOTE — Telephone Encounter (Signed)
-----   Message from Nori Riis, PA-C sent at 09/08/2021 11:08 AM EST ----- Regarding: PTNS Would you check with her insurance to see if it will cover her PTNS and if so get it scheduled for her?

## 2021-09-13 NOTE — Telephone Encounter (Signed)
No PA required for PTNS. Attempted to reach pt for scheduling, no VM set up, unable to leave pt a msg. Will attempt again tomorrow.

## 2021-09-17 NOTE — Telephone Encounter (Signed)
Pt scheduled for 12 PTNS appts, pt confirmed.

## 2021-09-28 ENCOUNTER — Other Ambulatory Visit: Payer: Self-pay

## 2021-09-28 ENCOUNTER — Ambulatory Visit (INDEPENDENT_AMBULATORY_CARE_PROVIDER_SITE_OTHER): Payer: Medicare Other | Admitting: Family Medicine

## 2021-09-28 DIAGNOSIS — N3281 Overactive bladder: Secondary | ICD-10-CM

## 2021-09-28 NOTE — Patient Instructions (Signed)
Tracking Your Bladder Symptoms    Patient Name:___________________________________________________   Sample: Day   Daytime Voids  Nighttime Voids Urgency for the Day(0-4) Number of Accidents Beverage Comments  Monday IIII II 2 I Water IIII Coffee  I      Week Starting:____________________________________   Day Daytime  Voids Nighttime  Voids Urgency for  The Day(0-4) Number of Accidents Beverages Comments                                                           This week my symptoms were:  O much better  O better O the same O worse   

## 2021-09-28 NOTE — Progress Notes (Signed)
PTNS  Session # 1 of 45  Health & Social Factors: No change Caffeine: 3 Alcohol: 0 Daytime voids #per day: 7 Night-time voids #per night: 3 Urgency: mild Incontinence Episodes #per day: 0 Ankle used: right Treatment Setting: 4 Feeling/ Response: sensory Comments: Patient tolerated well  Performed By: Elberta Leatherwood, CMA  Follow Up: 1 week . Sign consent  and video reviewed

## 2021-10-05 ENCOUNTER — Ambulatory Visit (INDEPENDENT_AMBULATORY_CARE_PROVIDER_SITE_OTHER): Payer: Medicare Other | Admitting: Family Medicine

## 2021-10-05 ENCOUNTER — Other Ambulatory Visit: Payer: Self-pay

## 2021-10-05 DIAGNOSIS — N3281 Overactive bladder: Secondary | ICD-10-CM | POA: Diagnosis not present

## 2021-10-05 NOTE — Patient Instructions (Signed)
Tracking Your Bladder Symptoms    Patient Name:___________________________________________________   Sample: Day   Daytime Voids  Nighttime Voids Urgency for the Day(0-4) Number of Accidents Beverage Comments  Monday IIII II 2 I Water IIII Coffee  I      Week Starting:____________________________________   Day Daytime  Voids Nighttime  Voids Urgency for  The Day(0-4) Number of Accidents Beverages Comments                                                           This week my symptoms were:  O much better  O better O the same O worse   

## 2021-10-05 NOTE — Progress Notes (Signed)
PTNS   Session # 2 of 45   Health & Social Factors: No change Caffeine: 1 Alcohol: 0 Daytime voids #per day: 6 Night-time voids #per night: 2 Urgency: mild Incontinence Episodes #per day: 0-1 Ankle used: right Treatment Setting: 11 Feeling/ Response: sensory Comments: Patient tolerated well   Performed By: Elberta Leatherwood, CMA   Follow Up: 1 week .

## 2021-10-12 ENCOUNTER — Other Ambulatory Visit: Payer: Self-pay

## 2021-10-12 ENCOUNTER — Ambulatory Visit (INDEPENDENT_AMBULATORY_CARE_PROVIDER_SITE_OTHER): Payer: Medicare Other | Admitting: *Deleted

## 2021-10-12 DIAGNOSIS — R3989 Other symptoms and signs involving the genitourinary system: Secondary | ICD-10-CM

## 2021-10-12 DIAGNOSIS — N3281 Overactive bladder: Secondary | ICD-10-CM

## 2021-10-12 DIAGNOSIS — R3914 Feeling of incomplete bladder emptying: Secondary | ICD-10-CM

## 2021-10-12 NOTE — Patient Instructions (Signed)
Tracking Your Bladder Symptoms    Patient Name:___________________________________________________   Sample: Day   Daytime Voids  Nighttime Voids Urgency for the Day(0-4) Number of Accidents Beverage Comments  Monday IIII II 2 I Water IIII Coffee  I      Week Starting:____________________________________   Day Daytime  Voids Nighttime  Voids Urgency for  The Day(0-4) Number of Accidents Beverages Comments                                                           This week my symptoms were:  O much better  O better O the same O worse   

## 2021-10-12 NOTE — Progress Notes (Signed)
PTNS   Session # 3 of 45   Health & Social Factors: No change Caffeine: 1 Alcohol: 0 Daytime voids #per day: 7 Night-time voids #per night: 3 Urgency: mild Incontinence Episodes #per day: 0-1 Ankle used: Left  Treatment Setting:4  Feeling/ Response: sensory Comments: Patient tolerated well   Performed By: France Ravens   Follow Up: 1 week .

## 2021-10-19 ENCOUNTER — Ambulatory Visit (INDEPENDENT_AMBULATORY_CARE_PROVIDER_SITE_OTHER): Payer: Medicare Other | Admitting: *Deleted

## 2021-10-19 ENCOUNTER — Other Ambulatory Visit: Payer: Self-pay

## 2021-10-19 DIAGNOSIS — N3281 Overactive bladder: Secondary | ICD-10-CM | POA: Diagnosis not present

## 2021-10-19 NOTE — Progress Notes (Signed)
PTNS   Session # 4 of 45   Health & Social Factors: No change Caffeine: 2 Alcohol: 0 Daytime voids #per day: 6 Night-time voids #per night: 3 Urgency: mild Incontinence Episodes #per day: 0-1 Ankle used: Right Treatment Setting:4  Feeling/ Response: sensory Comments: Patient tolerated well   Performed By: France Ravens  Follow up 1 week

## 2021-10-19 NOTE — Patient Instructions (Signed)
Tracking Your Bladder Symptoms    Patient Name:___________________________________________________   Sample: Day   Daytime Voids  Nighttime Voids Urgency for the Day(0-4) Number of Accidents Beverage Comments  Monday IIII II 2 I Water IIII Coffee  I      Week Starting:____________________________________   Day Daytime  Voids Nighttime  Voids Urgency for  The Day(0-4) Number of Accidents Beverages Comments                                                           This week my symptoms were:  O much better  O better O the same O worse   

## 2021-10-26 ENCOUNTER — Other Ambulatory Visit: Payer: Self-pay

## 2021-10-26 ENCOUNTER — Ambulatory Visit (INDEPENDENT_AMBULATORY_CARE_PROVIDER_SITE_OTHER): Payer: Medicare Other | Admitting: *Deleted

## 2021-10-26 DIAGNOSIS — N3281 Overactive bladder: Secondary | ICD-10-CM

## 2021-10-26 NOTE — Patient Instructions (Signed)
Tracking Your Bladder Symptoms    Patient Name:___________________________________________________   Sample: Day   Daytime Voids  Nighttime Voids Urgency for the Day(0-4) Number of Accidents Beverage Comments  Monday IIII II 2 I Water IIII Coffee  I      Week Starting:____________________________________   Day Daytime  Voids Nighttime  Voids Urgency for  The Day(0-4) Number of Accidents Beverages Comments                                                           This week my symptoms were:  O much better  O better O the same O worse   

## 2021-10-26 NOTE — Progress Notes (Signed)
PTNS   Session # 5 of 45   Health & Social Factors: No change Caffeine: 2 Alcohol: 0 Daytime voids #per day: 6 Night-time voids #per night: 3 Urgency: mild Incontinence Episodes #per day: 0-1 Ankle used: Left  Treatment Setting:7 Feeling/ Response: sensory Comments: Patient tolerated well   Performed FV:OHKGOV Garrison  CMA   Follow up 1 week

## 2021-11-02 ENCOUNTER — Other Ambulatory Visit: Payer: Self-pay

## 2021-11-02 ENCOUNTER — Ambulatory Visit (INDEPENDENT_AMBULATORY_CARE_PROVIDER_SITE_OTHER): Payer: Medicare Other | Admitting: *Deleted

## 2021-11-02 DIAGNOSIS — N3281 Overactive bladder: Secondary | ICD-10-CM | POA: Diagnosis not present

## 2021-11-02 NOTE — Patient Instructions (Signed)
Tracking Your Bladder Symptoms    Patient Name:___________________________________________________   Sample: Day   Daytime Voids  Nighttime Voids Urgency for the Day(0-4) Number of Accidents Beverage Comments  Monday IIII II 2 I Water IIII Coffee  I      Week Starting:____________________________________   Day Daytime  Voids Nighttime  Voids Urgency for  The Day(0-4) Number of Accidents Beverages Comments                                                           This week my symptoms were:  O much better  O better O the same O worse   

## 2021-11-02 NOTE — Progress Notes (Signed)
PTNS ?  ?Session # 6 of 45 ?  ?Health & Social Factors: No change ?Caffeine: 2 ?Alcohol: 0 ?Daytime voids #per day: 6 ?Night-time voids #per night: 3 ?Urgency: mild ?Incontinence Episodes #per day: 0-1 ?Ankle used: right  ?Treatment Setting:3 ?Feeling/ Response: sensory ?Comments: Patient tolerated well ?  ?Performed FD:VOUZHQU Madhav Mohon CMA ?  ?Follow up 1 week   ?

## 2021-11-09 ENCOUNTER — Other Ambulatory Visit: Payer: Self-pay

## 2021-11-09 ENCOUNTER — Ambulatory Visit (INDEPENDENT_AMBULATORY_CARE_PROVIDER_SITE_OTHER): Payer: Medicare Other | Admitting: Family Medicine

## 2021-11-09 DIAGNOSIS — N3281 Overactive bladder: Secondary | ICD-10-CM

## 2021-11-09 NOTE — Patient Instructions (Signed)
Tracking Your Bladder Symptoms    Patient Name:___________________________________________________   Sample: Day   Daytime Voids  Nighttime Voids Urgency for the Day(0-4) Number of Accidents Beverage Comments  Monday IIII II 2 I Water IIII Coffee  I      Week Starting:____________________________________   Day Daytime  Voids Nighttime  Voids Urgency for  The Day(0-4) Number of Accidents Beverages Comments                                                           This week my symptoms were:  O much better  O better O the same O worse   

## 2021-11-09 NOTE — Progress Notes (Signed)
PTNS ?  ?Session # 7 of 45 ?  ?Health & Social Factors: No change ?Caffeine: 2 ?Alcohol: 0 ?Daytime voids #per day: 7 ?Night-time voids #per night: 2 ?Urgency: mild ?Incontinence Episodes #per day: 0-1 ?Ankle used: Right ?Treatment Setting:4 ?Feeling/ Response: sensory ?Comments: Patient tolerated well ?  ?Performed AT:FTDDUK Louretta Shorten  CMA ?  ?Follow up 1 week   ?

## 2021-11-16 ENCOUNTER — Other Ambulatory Visit: Payer: Self-pay

## 2021-11-16 ENCOUNTER — Ambulatory Visit (INDEPENDENT_AMBULATORY_CARE_PROVIDER_SITE_OTHER): Payer: Medicare Other | Admitting: *Deleted

## 2021-11-16 DIAGNOSIS — N3281 Overactive bladder: Secondary | ICD-10-CM | POA: Diagnosis not present

## 2021-11-16 NOTE — Patient Instructions (Signed)
Tracking Your Bladder Symptoms    Patient Name:___________________________________________________   Sample: Day   Daytime Voids  Nighttime Voids Urgency for the Day(0-4) Number of Accidents Beverage Comments  Monday IIII II 2 I Water IIII Coffee  I      Week Starting:____________________________________   Day Daytime  Voids Nighttime  Voids Urgency for  The Day(0-4) Number of Accidents Beverages Comments                                                           This week my symptoms were:  O much better  O better O the same O worse   

## 2021-11-16 NOTE — Progress Notes (Signed)
PTNS ?  ?Session # 8 of 55 ?  ?Health & Social Factors: No change ?Caffeine: 2 ?Alcohol: 0 ?Daytime voids #per day: 7 ?Night-time voids #per night: 2 ?Urgency: mild ?Incontinence Episodes #per day: 0-1 ?Ankle used: Left  ?Treatment Setting:3 ?Feeling/ Response: sensory ?Comments: Patient tolerated well ?  ?Performed ET:[id number] Molly Figueroa CMA  ?  ?Follow up 1 week   ?

## 2021-11-23 ENCOUNTER — Ambulatory Visit: Payer: Medicare Other | Admitting: *Deleted

## 2021-11-23 ENCOUNTER — Other Ambulatory Visit: Payer: Self-pay

## 2021-11-23 DIAGNOSIS — N3281 Overactive bladder: Secondary | ICD-10-CM

## 2021-11-23 NOTE — Progress Notes (Signed)
PTNS ?  ?Session # 9 of 45 ?  ?Health & Social Factors: No change ?Caffeine: 2 ?Alcohol: 0 ?Daytime voids #per day: 7 ?Night-time voids #per night: 2 ?Urgency: mild ?Incontinence Episodes #per day: 0-1 ?Ankle used: Left  ?Treatment Setting:4 ?Feeling/ Response: sensory ?Comments: Patient tolerated well ?  ?Performed WO:[id number] Kjirsten Bloodgood CMA  ?  ?Follow up 1 week   ?

## 2021-11-23 NOTE — Patient Instructions (Signed)
Tracking Your Bladder Symptoms    Patient Name:___________________________________________________   Sample: Day   Daytime Voids  Nighttime Voids Urgency for the Day(0-4) Number of Accidents Beverage Comments  Monday IIII II 2 I Water IIII Coffee  I      Week Starting:____________________________________   Day Daytime  Voids Nighttime  Voids Urgency for  The Day(0-4) Number of Accidents Beverages Comments                                                           This week my symptoms were:  O much better  O better O the same O worse   

## 2021-11-30 ENCOUNTER — Ambulatory Visit: Payer: Medicare Other | Admitting: Physician Assistant

## 2021-12-07 ENCOUNTER — Ambulatory Visit (INDEPENDENT_AMBULATORY_CARE_PROVIDER_SITE_OTHER): Payer: Medicare Other | Admitting: Physician Assistant

## 2021-12-07 DIAGNOSIS — N3281 Overactive bladder: Secondary | ICD-10-CM

## 2021-12-07 NOTE — Progress Notes (Signed)
PTNS ?  ?Session # 10 of 23 ?  ?Health & Social Factors: No change ?Caffeine: 2 ?Alcohol: 0 ?Daytime voids #per day: 7 ?Night-time voids #per night: 2 ?Urgency: mild ?Incontinence Episodes #per day: 0-1 ?Ankle used: Left  ?Treatment Setting:8 ?Feeling/ Response: sensory ?Comments: Patient tolerated well ?  ?Performed UX:YBFXOVA Jazion Atteberry CMA  ?  ?Follow up 1 week   ?

## 2021-12-07 NOTE — Patient Instructions (Signed)
Tracking Your Bladder Symptoms    Patient Name:___________________________________________________   Sample: Day   Daytime Voids  Nighttime Voids Urgency for the Day(0-4) Number of Accidents Beverage Comments  Monday IIII II 2 I Water IIII Coffee  I      Week Starting:____________________________________   Day Daytime  Voids Nighttime  Voids Urgency for  The Day(0-4) Number of Accidents Beverages Comments                                                           This week my symptoms were:  O much better  O better O the same O worse   

## 2021-12-14 ENCOUNTER — Ambulatory Visit (INDEPENDENT_AMBULATORY_CARE_PROVIDER_SITE_OTHER): Payer: Medicare Other | Admitting: Physician Assistant

## 2021-12-14 DIAGNOSIS — N3281 Overactive bladder: Secondary | ICD-10-CM

## 2021-12-14 NOTE — Progress Notes (Signed)
PTNS ?  ?Session # 11 of 37 ?  ?Health & Social Factors: No change ?Caffeine: 2 ?Alcohol: 0 ?Daytime voids #per day: 6 ?Night-time voids #per night: 2 ?Urgency: mild ?Incontinence Episodes #per day: 0-1 ?Ankle used: right ?Treatment Setting:2 ?Feeling/ Response: sensory ?Comments: Patient tolerated well ?  ?Performed KL:TYVDPBA Jasmon Mattice CMA  ?  ?Follow up 1 week   ?

## 2021-12-14 NOTE — Patient Instructions (Signed)
Tracking Your Bladder Symptoms    Patient Name:___________________________________________________   Sample: Day   Daytime Voids  Nighttime Voids Urgency for the Day(0-4) Number of Accidents Beverage Comments  Monday IIII II 2 I Water IIII Coffee  I      Week Starting:____________________________________   Day Daytime  Voids Nighttime  Voids Urgency for  The Day(0-4) Number of Accidents Beverages Comments                                                           This week my symptoms were:  O much better  O better O the same O worse   

## 2021-12-21 ENCOUNTER — Ambulatory Visit (INDEPENDENT_AMBULATORY_CARE_PROVIDER_SITE_OTHER): Payer: Medicare Other | Admitting: Physician Assistant

## 2021-12-21 DIAGNOSIS — N3281 Overactive bladder: Secondary | ICD-10-CM

## 2021-12-21 NOTE — Progress Notes (Signed)
PTNS ? ?Session # 12 ? ?Health & Social Factors: no change ?Caffeine: 2 ?Alcohol: 0 ?Daytime voids #per day: 5 ?Night-time voids #per night: 2 ?Urgency: mild ?Incontinence Episodes #per day: 0 ?Ankle used: left ?Treatment Setting: 3 ?Feeling/ Response: toe flex and sensory ?Comments: patient tolerated well ? ?Performed By: Elberta Leatherwood, [place] ? ?Follow Up: 1 month for OV ? ?

## 2022-01-20 ENCOUNTER — Ambulatory Visit (INDEPENDENT_AMBULATORY_CARE_PROVIDER_SITE_OTHER): Payer: Medicare Other | Admitting: Physician Assistant

## 2022-01-20 ENCOUNTER — Encounter: Payer: Self-pay | Admitting: Physician Assistant

## 2022-01-20 VITALS — BP 132/72 | HR 77 | Ht 62.0 in | Wt 155.0 lb

## 2022-01-20 DIAGNOSIS — N3281 Overactive bladder: Secondary | ICD-10-CM | POA: Diagnosis not present

## 2022-01-20 NOTE — Progress Notes (Signed)
01/20/2022 11:49 AM   Molly Figueroa Molly Figueroa 1930-11-19 [id number]  CC: Chief Complaint  Patient presents with   Over Active Bladder   HPI: Molly Figueroa is a 88 y.o. female with PMH incomplete bladder emptying, vaginal atrophy, recurrent UTIs, renal cyst, right AML, and OAB wet who previously failed CIC, tamsulosin and Solifenacin, and Gemtesa who presents today for 1 month follow-up after completing 12 PTNS treatments.  Per chart review from PTNS sessions, reported episodes of daytime frequency decreased from 7-5, nocturia decreased from 3-2, and urge remained stable and mild.  Today she reports she is happy with her symptomatic improvement on PTNS.  She states she is feeling better overall and is especially pleased that she has not had any UTIs in the past 3 months.  Notably, she has noticed improvement in her urinary frequency and urgency.  She reports she will be moving to [place], [place] near [place] on June 3 and will need to find a local urologist there to pursue monthly maintenance PTNS.  PMH: Past Medical History:  Diagnosis Date   Allergies    Frequent UTI    Hypertension    IBS (irritable bowel syndrome)     Surgical History: Past Surgical History:  Procedure Laterality Date   ABDOMINAL HYSTERECTOMY     APPENDECTOMY     BACK SURGERY     HIP ARTHROPLASTY     JOINT REPLACEMENT  01/22/2019   R hip replacement   TONSILLECTOMY      Home Medications:  Allergies as of 01/20/2022       Reactions   Amlodipine Swelling   Codeine Nausea And Vomiting   Gabapentin Itching   Gentak [gentamicin Sulfate] Itching   Penicillin G    Phenothiazines Nausea And Vomiting   Tobradex [tobramycin-dexamethasone] Itching   Wellbutrin [bupropion] Swelling   Throat pain   Cefuroxime Axetil Rash   Fexofenadine Cough   Other reaction(s): Headache   Levofloxacin Rash   Macrolides And Ketolides Rash   Sulfa Antibiotics Rash        Medication List        Accurate as of Jan 20, 2022 11:49 AM. If you have any questions, ask your nurse or doctor.          STOP taking these medications    cholestyramine 4 g packet Commonly known as: QUESTRAN Stopped by: Debroah Loop, PA-C   cholestyramine 4 GM/DOSE powder Commonly known as: QUESTRAN Stopped by: Debroah Loop, PA-C   cyanocobalamin 1000 MCG/ML injection Commonly known as: (VITAMIN B-12) Stopped by: Debroah Loop, PA-C   diclofenac Sodium 1 % Gel Commonly known as: VOLTAREN Stopped by: Debroah Loop, PA-C   multivitamin with minerals Tabs tablet Stopped by: Debroah Loop, PA-C   ondansetron 4 MG tablet Commonly known as: ZOFRAN Stopped by: Debroah Loop, PA-C       TAKE these medications    acetaminophen 500 MG tablet Commonly known as: TYLENOL Take 500 mg by mouth every 6 (six) hours as needed for mild pain or fever.   acidophilus Caps capsule Take 1 capsule by mouth daily.   aspirin EC 81 MG tablet Take 81 mg by mouth daily.   azelastine 0.1 % nasal spray Commonly known as: ASTELIN Place 1 spray into both nostrils 2 (two) times daily.   cetirizine 10 MG tablet Commonly known as: ZYRTEC Take 10 mg by mouth daily.   cholecalciferol 25 MCG (1000 UNIT) tablet Commonly known as: VITAMIN D3 Take 1,000 Units by mouth daily.  escitalopram 20 MG tablet Commonly known as: LEXAPRO Take 20 mg by mouth daily.   Gemtesa 75 MG Tabs Generic drug: Vibegron Take 75 mg by mouth daily.   lidocaine 5 % Commonly known as: LIDODERM Place 1 patch onto the skin daily. Remove & Discard patch within 12 hours or as directed by MD   meloxicam 7.5 MG tablet Commonly known as: MOBIC Take 7.5 mg by mouth daily.   Olopatadine HCl 0.2 % Soln Place 1 drop into both eyes daily as needed (eye irritations).   omeprazole 20 MG capsule Commonly known as: PRILOSEC Take 20 mg by mouth 2 (two) times daily before a meal.   telmisartan 20 MG  tablet Commonly known as: MICARDIS Take 40 mg by mouth daily.   traMADol 50 MG tablet Commonly known as: ULTRAM Take 1 tablet (50 mg total) by mouth every 8 (eight) hours as needed for moderate pain or severe pain.   triamterene-hydrochlorothiazide 37.5-25 MG tablet Commonly known as: MAXZIDE-25 Take 0.5 tablets by mouth daily.        Allergies:  Allergies  Allergen Reactions   Amlodipine Swelling   Codeine Nausea And Vomiting   Gabapentin Itching   Gentak [Gentamicin Sulfate] Itching   Penicillin G    Phenothiazines Nausea And Vomiting   Tobradex [Tobramycin-Dexamethasone] Itching   Wellbutrin [Bupropion] Swelling    Throat pain    Cefuroxime Axetil Rash   Fexofenadine Cough    Other reaction(s): Headache   Levofloxacin Rash   Macrolides And Ketolides Rash   Sulfa Antibiotics Rash    Family History: Family History  Problem Relation Age of Onset   CAD Mother    Bladder Cancer Neg Hx    Kidney cancer Neg Hx     Social History:   reports that she has never smoked. She has never used smokeless tobacco. She reports that she does not currently use alcohol. She reports that she does not use drugs.  Physical Exam: BP 132/72   Pulse 77   Ht '5\' 2"'$  (1.575 m)   Wt 155 lb (70.3 kg)   BMI 28.35 kg/m   Constitutional:  Alert and oriented, no acute distress, nontoxic appearing HEENT: [place], AT Cardiovascular: No clubbing, cyanosis, or edema Respiratory: Normal respiratory effort, no increased work of breathing Skin: No rashes, bruises or suspicious lesions Neurologic: Grossly intact, no focal deficits, moving all 4 extremities Psychiatric: Normal mood and affect  Assessment & Plan:   1. Overactive bladder Patient reports improved urinary frequency and urgency after completing 12 PTNS treatments.  She will be moving out of town and needs help finding a Tax adviser near [place], [place] to continue maintenance treatments.  I will plan to bring her in within the next  week prior to her move for her first monthly maintenance PTNS treatment.  I have called several urologists in the [place] area and [facility] in [place] does perform this treatment.  I have placed a referral for her to see them upon her arrival. - Ambulatory referral to Urology  Return in about 1 week (around 01/27/2022) for PTNS maintenance.  Debroah Loop, PA-C  [facility] [address] [place], [place] [address] [phone]

## 2022-01-26 ENCOUNTER — Ambulatory Visit (INDEPENDENT_AMBULATORY_CARE_PROVIDER_SITE_OTHER): Payer: Medicare Other | Admitting: Physician Assistant

## 2022-01-26 DIAGNOSIS — N3281 Overactive bladder: Secondary | ICD-10-CM | POA: Diagnosis not present

## 2022-01-26 NOTE — Patient Instructions (Signed)
[facility] [place], [place]  [phone]

## 2022-01-26 NOTE — Progress Notes (Signed)
PTNS  Session # 13 of 45 (monthly maintenance)  Health & Social Factors: no change Caffeine: 2 Alcohol: 0 Daytime voids #per day: 4-5 Night-time voids #per night: 2 Urgency: mild Incontinence Episodes #per day: 0 Ankle used: right Treatment Setting: 2 Feeling/ Response: both Comments: patient tolerated well, no complications  Performed By: Fonnie Jarvis, CMA  Follow Up: Patient is moving and will establish care at [facility], referral was placed last week.

## 2022-02-24 ENCOUNTER — Other Ambulatory Visit: Payer: Self-pay | Admitting: Urology

## 2022-02-24 DIAGNOSIS — R350 Frequency of micturition: Secondary | ICD-10-CM

## 2022-03-21 ENCOUNTER — Ambulatory Visit: Payer: Self-pay | Admitting: Urology
